# Patient Record
Sex: Female | Born: 1975 | Race: White | Hispanic: No | Marital: Married | Smoking: Former smoker
Health system: Southern US, Community
[De-identification: ages and names within clinical notes are randomized; demographics above are authoritative.]

## PROBLEM LIST (undated history)

## (undated) ENCOUNTER — Inpatient Hospital Stay (HOSPITAL_COMMUNITY): Payer: Self-pay

## (undated) DIAGNOSIS — G43909 Migraine, unspecified, not intractable, without status migrainosus: Secondary | ICD-10-CM

## (undated) DIAGNOSIS — G4700 Insomnia, unspecified: Secondary | ICD-10-CM

## (undated) DIAGNOSIS — K219 Gastro-esophageal reflux disease without esophagitis: Secondary | ICD-10-CM

## (undated) DIAGNOSIS — R112 Nausea with vomiting, unspecified: Secondary | ICD-10-CM

## (undated) DIAGNOSIS — O24419 Gestational diabetes mellitus in pregnancy, unspecified control: Secondary | ICD-10-CM

## (undated) DIAGNOSIS — F32A Depression, unspecified: Secondary | ICD-10-CM

## (undated) DIAGNOSIS — J45990 Exercise induced bronchospasm: Secondary | ICD-10-CM

## (undated) DIAGNOSIS — L509 Urticaria, unspecified: Secondary | ICD-10-CM

## (undated) DIAGNOSIS — Z9889 Other specified postprocedural states: Secondary | ICD-10-CM

## (undated) DIAGNOSIS — K589 Irritable bowel syndrome without diarrhea: Secondary | ICD-10-CM

## (undated) DIAGNOSIS — R110 Nausea: Secondary | ICD-10-CM

## (undated) DIAGNOSIS — F329 Major depressive disorder, single episode, unspecified: Secondary | ICD-10-CM

## (undated) HISTORY — DX: Urticaria, unspecified: L50.9

## (undated) HISTORY — DX: Insomnia, unspecified: G47.00

## (undated) HISTORY — DX: Nausea: R11.0

## (undated) HISTORY — DX: Depression, unspecified: F32.A

## (undated) HISTORY — DX: Major depressive disorder, single episode, unspecified: F32.9

---

## 2000-09-13 ENCOUNTER — Encounter: Payer: Self-pay | Admitting: Family Medicine

## 2000-09-13 ENCOUNTER — Ambulatory Visit (HOSPITAL_COMMUNITY): Admission: RE | Admit: 2000-09-13 | Discharge: 2000-09-13 | Payer: Self-pay | Admitting: Family Medicine

## 2002-06-23 ENCOUNTER — Inpatient Hospital Stay (HOSPITAL_COMMUNITY): Admission: AD | Admit: 2002-06-23 | Discharge: 2002-06-23 | Payer: Self-pay | Admitting: Obstetrics and Gynecology

## 2002-06-23 ENCOUNTER — Encounter: Payer: Self-pay | Admitting: Obstetrics and Gynecology

## 2004-01-26 ENCOUNTER — Emergency Department (HOSPITAL_COMMUNITY): Admission: EM | Admit: 2004-01-26 | Discharge: 2004-01-27 | Payer: Self-pay | Admitting: *Deleted

## 2006-10-06 ENCOUNTER — Inpatient Hospital Stay (HOSPITAL_COMMUNITY): Admission: AD | Admit: 2006-10-06 | Discharge: 2006-10-06 | Payer: Self-pay | Admitting: Obstetrics and Gynecology

## 2006-10-16 ENCOUNTER — Inpatient Hospital Stay (HOSPITAL_COMMUNITY): Admission: AD | Admit: 2006-10-16 | Discharge: 2006-10-19 | Payer: Self-pay | Admitting: Obstetrics & Gynecology

## 2007-04-27 ENCOUNTER — Ambulatory Visit (HOSPITAL_COMMUNITY): Admission: RE | Admit: 2007-04-27 | Discharge: 2007-04-27 | Payer: Self-pay | Admitting: Family Medicine

## 2007-06-17 ENCOUNTER — Emergency Department (HOSPITAL_COMMUNITY): Admission: EM | Admit: 2007-06-17 | Discharge: 2007-06-17 | Payer: Self-pay | Admitting: Emergency Medicine

## 2007-06-17 ENCOUNTER — Encounter: Payer: Self-pay | Admitting: Orthopedic Surgery

## 2007-06-28 ENCOUNTER — Ambulatory Visit: Payer: Self-pay | Admitting: Orthopedic Surgery

## 2007-06-28 DIAGNOSIS — S40029A Contusion of unspecified upper arm, initial encounter: Secondary | ICD-10-CM | POA: Insufficient documentation

## 2009-02-18 ENCOUNTER — Ambulatory Visit (HOSPITAL_COMMUNITY): Admission: RE | Admit: 2009-02-18 | Discharge: 2009-02-18 | Payer: Self-pay | Admitting: Family Medicine
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
## 2012-08-04 ENCOUNTER — Encounter: Payer: Self-pay | Admitting: *Deleted

## 2012-09-25 ENCOUNTER — Encounter: Payer: Self-pay | Admitting: Nurse Practitioner

## 2012-09-25 ENCOUNTER — Ambulatory Visit (INDEPENDENT_AMBULATORY_CARE_PROVIDER_SITE_OTHER): Payer: 59 | Admitting: Nurse Practitioner

## 2012-09-25 VITALS — Temp 98.3°F | Wt 155.4 lb

## 2012-09-25 DIAGNOSIS — N390 Urinary tract infection, site not specified: Secondary | ICD-10-CM

## 2012-09-25 DIAGNOSIS — R300 Dysuria: Secondary | ICD-10-CM

## 2012-09-25 LAB — POCT URINALYSIS DIPSTICK: Spec Grav, UA: 1.015

## 2012-09-25 LAB — POCT UA - MICROSCOPIC ONLY
Mucus, UA: POSITIVE
WBC, Ur, HPF, POC: 5

## 2012-09-25 MED ORDER — NITROFURANTOIN MONOHYD MACRO 100 MG PO CAPS: 100.0000 mg | ORAL_CAPSULE | Freq: Two times a day (BID) | ORAL | Status: DC

## 2012-09-25 NOTE — Patient Instructions (Signed)
AZO as directed for bladder spasms.  Take for 48 hours then stop.

## 2012-09-26 ENCOUNTER — Encounter: Payer: Self-pay | Admitting: Nurse Practitioner

## 2012-09-26 NOTE — Progress Notes (Signed)
Subjective:  Presents for complaints of a foul odor that began in her urine yesterday. Today began having some dysuria and pain at the end of urination. Noticed slight blood in her urine x1. No fever. Taking fluids well. No urinary frequency. Urine cloudy and slightly darker than usual. Mild low back pain. No flank pain. Just finished a normal menstrual cycle. Uses condoms for birth control. No history of recent UTI. Nausea, no vomiting. Married, same sexual partner. No vaginal discharge.  Objective:   Temp(Src) 98.3 F (36.8 C) (Oral)  Wt 155 lb 6.4 oz (70.489 kg)  LMP 09/19/2012 NAD. Alert, oriented. Lungs clear. No CVA area tenderness. Heart regular rate rhythm. Abdomen soft nondistended with mild mid pelvic area tenderness. Urine microscopic 5+ WBCs with occasional RBC and multiple bacteria per HPF noted.  Assessment:Dysuria - Plan: POCT urinalysis dipstick  UTI (urinary tract infection) - Plan: POCT UA - Microscopic Only  Plan: Macrobid 100 mg twice a day x7 days. AZO for 48 hours then DC. Warning signs reviewed. Call back in 48 hours if no improvement, sooner if worse.

## 2012-11-09 ENCOUNTER — Ambulatory Visit (INDEPENDENT_AMBULATORY_CARE_PROVIDER_SITE_OTHER): Payer: 59 | Admitting: Family Medicine

## 2012-11-09 ENCOUNTER — Encounter: Payer: Self-pay | Admitting: Family Medicine

## 2012-11-09 VITALS — BP 134/88 | HR 70 | Wt 156.4 lb

## 2012-11-09 DIAGNOSIS — F329 Major depressive disorder, single episode, unspecified: Secondary | ICD-10-CM

## 2012-11-09 DIAGNOSIS — R51 Headache: Secondary | ICD-10-CM

## 2012-11-09 DIAGNOSIS — K219 Gastro-esophageal reflux disease without esophagitis: Secondary | ICD-10-CM
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
## 2012-11-09 NOTE — Patient Instructions (Signed)
If choking worsens, call us and the next step would be GI consult

## 2012-11-11 DIAGNOSIS — R51 Headache: Secondary | ICD-10-CM | POA: Insufficient documentation

## 2012-11-11 DIAGNOSIS — K219 Gastro-esophageal reflux disease without esophagitis: Secondary | ICD-10-CM | POA: Insufficient documentation

## 2012-11-11 DIAGNOSIS — R519 Headache, unspecified: Secondary | ICD-10-CM | POA: Insufficient documentation

## 2012-11-11 DIAGNOSIS — F32A Depression, unspecified: Secondary | ICD-10-CM | POA: Insufficient documentation

## 2012-11-11 DIAGNOSIS — F329 Major depressive disorder, single episode, unspecified: Secondary | ICD-10-CM | POA: Insufficient documentation

## 2012-11-12 ENCOUNTER — Telehealth: Payer: Self-pay | Admitting: Family Medicine

## 2012-11-12 ENCOUNTER — Ambulatory Visit: Payer: 59 | Admitting: Family Medicine

## 2012-11-12 MED ORDER — BUTALBITAL-ACETAMINOPHEN 50-325 MG PO TABS: ORAL_TABLET | ORAL | Status: DC

## 2012-11-12 NOTE — Telephone Encounter (Signed)
Discussed with pt and script sent [facility] [place] [facility] with 5 refills

## 2012-11-12 NOTE — Telephone Encounter (Signed)
Patient seen Dr. Vincente Poli this morning and she prescribed her Butabatol back in 2008, and Rx is to be moved for Dr. Brett Canales to start prescribing.  [facility] [place] [facility]

## 2012-11-12 NOTE — Telephone Encounter (Signed)
Ok, we'll do up to sixty per mo with five ref. Must last onemo. And, must be seen every six mos

## 2012-11-26 ENCOUNTER — Telehealth: Payer: Self-pay | Admitting: Family Medicine

## 2012-11-26 NOTE — Telephone Encounter (Signed)
zoloft 100mg  #45 one and a half qd with 5 refills called into [facility] [place]. Pt notified on her voicemail

## 2012-11-26 NOTE — Telephone Encounter (Signed)
Patient says pharm did not receive zoloft. She needs a refill.  [facility]

## 2013-03-14 ENCOUNTER — Encounter: Payer: Self-pay | Admitting: Family Medicine

## 2013-03-14 ENCOUNTER — Ambulatory Visit (INDEPENDENT_AMBULATORY_CARE_PROVIDER_SITE_OTHER): Payer: 59 | Admitting: Family Medicine

## 2013-03-14 VITALS — BP 120/86 | Temp 99.0°F | Ht 65.5 in | Wt 163.0 lb

## 2013-03-14 DIAGNOSIS — J329 Chronic sinusitis, unspecified: Secondary | ICD-10-CM

## 2013-03-14 MED ORDER — AZITHROMYCIN 250 MG PO TABS: ORAL_TABLET | ORAL | Status: DC

## 2013-03-14 NOTE — Progress Notes (Signed)
  Subjective:    Patient ID: [name], female    DOB: Mar 05, 1976, 37 y.o.   MRN: [id number]  Sinusitis This is a new problem. The current episode started in the past 7 days. The problem is unchanged. There has been no fever. Her pain is at a severity of 5/10. The pain is moderate. Associated symptoms include congestion, ear pain, headaches, sinus pressure and a sore throat. Past treatments include oral decongestants. The treatment provided no relief.   Neg strep exposure,  Pos cong   Review of Systems  HENT: Positive for congestion, ear pain, sinus pressure and sore throat.   Neurological: Positive for headaches.   Measure any fever--not yet Using advil and ibu bilt sinus    Objective:   Physical Exam  Alert moderate malaise. Lungs clear. Heart regular rate and rhythm. Frontal maxillary tenderness evident pharynx erythematous some tender anterior nodes.      Assessment & Plan:  Impression rhinosinusitis plan antibiotics prescribed. Symptomatic care discussed. WSL

## 2013-04-03 ENCOUNTER — Ambulatory Visit (INDEPENDENT_AMBULATORY_CARE_PROVIDER_SITE_OTHER): Payer: 59 | Admitting: *Deleted

## 2013-04-03 DIAGNOSIS — Z23 Encounter for immunization: Secondary | ICD-10-CM

## 2013-05-18 ENCOUNTER — Other Ambulatory Visit: Payer: Self-pay | Admitting: Nurse Practitioner

## 2013-05-19 ENCOUNTER — Other Ambulatory Visit: Payer: Self-pay | Admitting: Family Medicine

## 2013-05-20 NOTE — Telephone Encounter (Signed)
One mo ok will ntbs before further since controlled sub

## 2013-06-18 ENCOUNTER — Other Ambulatory Visit: Payer: Self-pay | Admitting: Family Medicine

## 2013-06-18 NOTE — Telephone Encounter (Signed)
Last office visit 03/14/13 (sick)

## 2013-06-18 NOTE — Telephone Encounter (Signed)
On dec 15th, we said needs ov before more ref numb 30 no refills. This is a controlled substance and we need to see q 6 months last ov for this june

## 2013-08-01 ENCOUNTER — Ambulatory Visit: Payer: 59 | Admitting: Nurse Practitioner

## 2013-08-05 ENCOUNTER — Telehealth: Payer: Self-pay | Admitting: Family Medicine

## 2013-08-05 MED ORDER — BUTALBITAL-ACETAMINOPHEN 50-325 MG PO TABS: ORAL_TABLET | ORAL | Status: DC

## 2013-08-05 NOTE — Telephone Encounter (Signed)
ok 

## 2013-08-05 NOTE — Telephone Encounter (Signed)
Patient notified

## 2013-08-05 NOTE — Telephone Encounter (Signed)
Patient could not come to followup appointment last week due to weather. She has a followup appointment on Friday, but she will run out of the medication she needs before then. She is hoping she can have one more refill on her ACETAMINOPHEN-BUTALBITAL 50-325 MG TABS.   [facility] [address]

## 2013-08-09 ENCOUNTER — Encounter: Payer: Self-pay | Admitting: Nurse Practitioner

## 2013-08-09 ENCOUNTER — Ambulatory Visit (INDEPENDENT_AMBULATORY_CARE_PROVIDER_SITE_OTHER): Payer: 59 | Admitting: Nurse Practitioner

## 2013-08-09 VITALS — BP 122/82 | Ht 65.5 in

## 2013-08-09 DIAGNOSIS — R51 Headache: Secondary | ICD-10-CM

## 2013-08-09 DIAGNOSIS — J309 Allergic rhinitis, unspecified: Secondary | ICD-10-CM

## 2013-08-09 DIAGNOSIS — F341 Dysthymic disorder: Secondary | ICD-10-CM

## 2013-08-09 DIAGNOSIS — J300 Vasomotor rhinitis: Secondary | ICD-10-CM

## 2013-08-09 DIAGNOSIS — K219 Gastro-esophageal reflux disease without esophagitis: Secondary | ICD-10-CM

## 2013-08-09 DIAGNOSIS — F418 Other specified anxiety disorders: Secondary | ICD-10-CM | POA: Insufficient documentation

## 2013-08-09 MED ORDER — BUTALBITAL-ACETAMINOPHEN 50-325 MG PO TABS: ORAL_TABLET | ORAL | Status: DC

## 2013-08-09 MED ORDER — SERTRALINE HCL 100 MG PO TABS: ORAL_TABLET | ORAL | Status: DC

## 2013-08-09 MED ORDER — OMEPRAZOLE 40 MG PO CPDR: 40.0000 mg | DELAYED_RELEASE_CAPSULE | Freq: Every day | ORAL | Status: DC

## 2013-08-09 NOTE — Patient Instructions (Signed)
  Nasacort AQ  Diet for Gastroesophageal Reflux Disease, Adult Reflux (acid reflux) is when acid from your stomach flows up into the esophagus. When acid comes in contact with the esophagus, the acid causes irritation and soreness (inflammation) in the esophagus. When reflux happens often or so severely that it causes damage to the esophagus, it is called gastroesophageal reflux disease (GERD). Nutrition therapy can help ease the discomfort of GERD. FOODS OR DRINKS TO AVOID OR LIMIT  Smoking or chewing tobacco. Nicotine is one of the most potent stimulants to acid production in the gastrointestinal tract.  Caffeinated and decaffeinated coffee and black tea.  Regular or low-calorie carbonated beverages or energy drinks (caffeine-free carbonated beverages are allowed).   Strong spices, such as black pepper, white pepper, red pepper, cayenne, curry powder, and chili powder.  Peppermint or spearmint.  Chocolate.  High-fat foods, including meats and fried foods. Extra added fats including oils, butter, salad dressings, and nuts. Limit these to less than 8 tsp per day.  Fruits and vegetables if they are not tolerated, such as citrus fruits or tomatoes.  Alcohol.  Any food that seems to aggravate your condition. If you have questions regarding your diet, call your caregiver or a registered dietitian. OTHER THINGS THAT MAY HELP GERD INCLUDE:   Eating your meals slowly, in a relaxed setting.  Eating 5 to 6 small meals per day instead of 3 large meals.  Eliminating food for a period of time if it causes distress.  Not lying down until 3 hours after eating a meal.  Keeping the head of your bed raised 6 to 9 inches (15 to 23 cm) by using a foam wedge or blocks under the legs of the bed. Lying flat may make symptoms worse.  Being physically active. Weight loss may be helpful in reducing reflux in overweight or obese adults.  Wear loose fitting clothing EXAMPLE MEAL PLAN This meal plan  is approximately 2,000 calories based on https://www.[place].org/ChooseMyPlate.gov meal planning guidelines. Breakfast   cup cooked oatmeal.  1 cup strawberries.  1 cup low-fat milk.  1 oz almonds. Snack  1 cup cucumber slices.  6 oz yogurt (made from low-fat or fat-free milk). Lunch  2 slice whole-wheat bread.  2 oz sliced [place]urkey.  2 tsp mayonnaise.  1 cup blueberries.  1 cup snap peas. Snack  6 whole-wheat crackers.  1 oz string cheese. Dinner   cup brown rice.  1 cup mixed veggies.  1 tsp olive oil.  3 oz grilled fish. Document Released: 05/23/2005 Document Revised: 08/15/2011 Document Reviewed: 04/08/2011 [facility] Patient Information 2014 [place], [place].

## 2013-08-10 ENCOUNTER — Encounter: Payer: Self-pay | Admitting: Nurse Practitioner

## 2013-08-10 NOTE — Progress Notes (Signed)
Subjective:  Presents for recheck. Averaging about 2 migraines per year requesting refills on her headache medicine to have on hand. Also a flareup of her reflux symptoms lately. To omeprazole 40 mg which completely relieved her symptoms. No nausea vomiting. No abdominal pain. Bowels normal limit. Limited caffeine intake. Rare social alcohol. Nonsmoker. No excessive NSAID use. Also complaints of head congestion with off and on facial area pressure. No fever. Rare cough. Scratchy throat. Ear pressure. Doing well on Zoloft. Has consulted with her gynecologist since she plans to try to conceive again sometime in the next year. Has been clear to continue Zoloft and current headache medicine  which she has taken during previous pregnancies without difficulty.  Objective:   BP 122/82  Ht 5' 5.5" (1.664 m) NAD. Alert, oriented. TMs clear effusion, no erythema. pharynx not erythematous with cloudy PND noted. Neck supple with mild soft anterior adenopathy. Lungs clear. Heart regular rate rhythm. Abdomen soft nondistended nontender.    Assessment: Problem List Items Addressed This Visit     Digestive   Esophageal reflux   Relevant Medications      omeprazole (PRILOSEC) capsule     Other   Headache(784.0) - Primary   Relevant Medications      sertraline (ZOLOFT) tablet      ACETAMINOPHEN-BUTALBITAL 50-325 MG TABS   Depression with anxiety    Other Visit Diagnoses   Vasomotor rhinitis           Plan: Meds ordered this encounter  Medications  . sertraline (ZOLOFT) 100 MG tablet    Sig: TAKE 1 AND 1/2 TAB EVERY DAY    Dispense:  45 tablet    Refill:  5    Order Specific Question:  Supervising Provider    Answer:  Merlyn AlbertLUKING, WILLIAM S [[id number]]  . ACETAMINOPHEN-BUTALBITAL 50-325 MG TABS    Sig: 1-2 po q 4-6 hours prn headache    Dispense:  60 each    Refill:  5    Order Specific Question:  Supervising Provider    Answer:  Merlyn AlbertLUKING, WILLIAM S [[id number]]  . omeprazole (PRILOSEC) 40 MG capsule    Sig:  Take 1 capsule (40 mg total) by mouth daily. Prn acid reflux    Dispense:  30 capsule    Refill:  5    Order Specific Question:  Supervising Provider    Answer:  Merlyn AlbertLUKING, WILLIAM S [[id number]]   Call back in 2 weeks if reflux persists. May decrease to when necessary basis once symptoms have improved. Given written and verbal information on lifestyle factors affecting her reflux symptoms. OTC antihistamines and Nasacort AQ as directed. Otherwise routine followup in 6 months.

## 2013-08-30 ENCOUNTER — Telehealth: Payer: Self-pay | Admitting: Family Medicine

## 2013-08-30 MED ORDER — AZITHROMYCIN 250 MG PO TABS: ORAL_TABLET | ORAL | Status: DC

## 2013-08-30 NOTE — Telephone Encounter (Signed)
Error was made. °

## 2013-08-30 NOTE — Telephone Encounter (Signed)
Medication was sent to pharmacy. Phone number listed below is the wrong number.

## 2013-08-30 NOTE — Telephone Encounter (Signed)
ok 

## 2013-08-30 NOTE — Addendum Note (Signed)
Addended by: Dereck LigasJOHNSON, Rutha Melgoza P on: 08/30/2013 11:45 AM   Modules accepted: Orders

## 2013-08-30 NOTE — Telephone Encounter (Signed)
Patient is a fieldtrip in [place] has cough, head congestion, chest congestion ,no fever has been taking mucus-DM and muscus sinus/cold but no relief can you call in [facility] at [facility] [place] [facility]. She wont be back until 11:30 tonight.Let  Know if you can call it in [phone].

## 2013-09-08 ENCOUNTER — Other Ambulatory Visit: Payer: Self-pay | Admitting: Family Medicine

## 2013-09-08 ENCOUNTER — Other Ambulatory Visit: Payer: Self-pay | Admitting: Nurse Practitioner

## 2013-09-09 NOTE — Telephone Encounter (Signed)
caroly did five monthly ref at l;ast mo visit so let's do that again

## 2013-09-12 ENCOUNTER — Ambulatory Visit (HOSPITAL_COMMUNITY)
Admission: RE | Admit: 2013-09-12 | Discharge: 2013-09-12 | Disposition: A | Discharge status: Home / Self Care | Payer: 59 | Source: Ambulatory Visit | Attending: Family Medicine | Admitting: Family Medicine

## 2013-09-12 ENCOUNTER — Ambulatory Visit (INDEPENDENT_AMBULATORY_CARE_PROVIDER_SITE_OTHER): Payer: 59 | Admitting: Family Medicine

## 2013-09-12 ENCOUNTER — Encounter: Payer: Self-pay | Admitting: Family Medicine

## 2013-09-12 VITALS — BP 112/72 | Ht 65.5 in

## 2013-09-12 DIAGNOSIS — M25511 Pain in right shoulder: Secondary | ICD-10-CM

## 2013-09-12 DIAGNOSIS — M25519 Pain in unspecified shoulder: Secondary | ICD-10-CM | POA: Insufficient documentation

## 2013-09-12 MED ORDER — CHLORZOXAZONE 500 MG PO TABS: 500.0000 mg | ORAL_TABLET | Freq: Four times a day (QID) | ORAL | Status: DC | PRN

## 2013-09-12 NOTE — Progress Notes (Signed)
   Subjective:    Patient ID: [name], female    DOB: 06/28/1975, 38 y.o.   MRN: [id number]  HPIRight shoulder pain. Old softball injury from a couple years ago. Takes ibuprofen and tylenol.  Has done exercises and chirp prac care  Doesn't want surg  Right sharp stinging pain with throwing  tyl or ibu doesn't jhelp  Heating pafd nightly over a yr    Review of Systems No joint pain elsewhere no neck pain no back pain some radiation into the arm.    Objective:   Physical Exam Alert no acute distress vitals stable. Lungs clear. Heart regular rate and rhythm. Shoulder positive impingement sign. Positive tenderness in the a.c. joint region.  Procedure note patient was prepped draped anesthetized and injected with 1 cc Solu-Medrol and 2 cc Xylocaine     Assessment & Plan:  Impression chronic shoulder pain discussed at length. Patient adamant about not getting surgery or see an orthopedic doctor. However pain is been going on for years. Plan trial of therapeutic injection rationale discussed. X-ray involves shoulder. Local measures discussed. Codman's exercises discussed. WSL

## 2013-09-13 ENCOUNTER — Telehealth: Payer: Self-pay | Admitting: Family Medicine

## 2013-09-13 NOTE — Telephone Encounter (Signed)
Calling to get results to xray °

## 2013-09-15 NOTE — Telephone Encounter (Signed)
See res notify pt

## 2013-09-16 NOTE — Telephone Encounter (Signed)
Results discussed with patient. Patient verbalized understanding. 

## 2013-11-18 ENCOUNTER — Ambulatory Visit: Payer: 59 | Admitting: Family Medicine

## 2013-11-19 ENCOUNTER — Ambulatory Visit: Payer: 59 | Admitting: Family Medicine

## 2013-11-20 ENCOUNTER — Ambulatory Visit: Payer: 59 | Admitting: Family Medicine

## 2013-11-22 ENCOUNTER — Ambulatory Visit (INDEPENDENT_AMBULATORY_CARE_PROVIDER_SITE_OTHER): Payer: 59 | Admitting: Family Medicine

## 2013-11-22 ENCOUNTER — Encounter: Payer: Self-pay | Admitting: Family Medicine

## 2013-11-22 VITALS — BP 128/90 | Ht 65.5 in

## 2013-11-22 DIAGNOSIS — J329 Chronic sinusitis, unspecified: Secondary | ICD-10-CM

## 2013-11-22 DIAGNOSIS — J301 Allergic rhinitis due to pollen: Secondary | ICD-10-CM

## 2013-11-22 DIAGNOSIS — J310 Chronic rhinitis: Secondary | ICD-10-CM

## 2013-11-22 MED ORDER — CLARITHROMYCIN 500 MG PO TABS: 500.0000 mg | ORAL_TABLET | Freq: Two times a day (BID) | ORAL | Status: DC

## 2013-11-22 MED ORDER — METHYLPREDNISOLONE ACETATE 40 MG/ML IJ SUSP
80.0000 mg | Freq: Once | INTRAMUSCULAR | Status: AC
  Administered 2013-11-22: 80 mg via INTRAMUSCULAR

## 2013-11-22 NOTE — Progress Notes (Signed)
   Subjective:    Patient ID: [name], female    DOB: 06/13/1975, 38 y.o.   MRN: [id number]  Sinusitis This is a new problem. Episode onset: 8 weeks ago. The problem is unchanged. There has been no fever. Associated symptoms include congestion, coughing, headaches, sinus pressure and sneezing. Past treatments include oral decongestants, spray decongestants, saline sprays, lying down and acetaminophen. The treatment provided no relief.   Cong and drainage  Sig trouble with allergies  Taking a lot of meds and symptoms  Afrin clogs up after having used  Sig sneezing one after the othe  Bad pressure frontal headache  Sinus pressure ongoing   Nettie pots helps a litlle  Night clogged up  /     Review of Systems  HENT: Positive for congestion, sinus pressure and sneezing.   Respiratory: Positive for cough.   Neurological: Positive for headaches.       Objective:   Physical Exam  Alert mild malaise. Vitals reviewed. HEENT. Moderate nasal congestion. TMs somewhat retracted pharynx normal. Neck supple. Lungs clear. Heart regular in rhythm.      Assessment & Plan:  Impression 1 protracted rhinosinusitis/allergic rhinitis plan Depo-Medrol injection. Use steroid nasal spray faithfully. Antibiotics prescribed. Symptomatic care discussed. WSL

## 2013-11-24 DIAGNOSIS — J3089 Other allergic rhinitis: Secondary | ICD-10-CM | POA: Insufficient documentation

## 2013-12-22 ENCOUNTER — Other Ambulatory Visit: Payer: Self-pay | Admitting: Nurse Practitioner

## 2013-12-23 NOTE — Telephone Encounter (Signed)
60 was rxed on march 6 with five more ref, how come out already???

## 2013-12-23 NOTE — Telephone Encounter (Signed)
Last seen 11/22/13.

## 2013-12-24 NOTE — Telephone Encounter (Signed)
Pt said she is only getting her rx refilled once a month. She believes it is a [facility]. She will call us and let us know if she has any trouble.

## 2014-02-08 ENCOUNTER — Other Ambulatory Visit: Payer: Self-pay | Admitting: Nurse Practitioner

## 2014-03-06 ENCOUNTER — Ambulatory Visit (INDEPENDENT_AMBULATORY_CARE_PROVIDER_SITE_OTHER): Payer: 59 | Admitting: Nurse Practitioner

## 2014-03-06 ENCOUNTER — Encounter: Payer: Self-pay | Admitting: Nurse Practitioner

## 2014-03-06 VITALS — BP 130/90 | Ht 65.5 in | Wt 181.0 lb

## 2014-03-06 DIAGNOSIS — R5383 Other fatigue: Secondary | ICD-10-CM

## 2014-03-06 DIAGNOSIS — F418 Other specified anxiety disorders: Secondary | ICD-10-CM

## 2014-03-06 DIAGNOSIS — G4700 Insomnia, unspecified: Secondary | ICD-10-CM

## 2014-03-06 MED ORDER — SERTRALINE HCL 100 MG PO TABS: ORAL_TABLET | ORAL | Status: DC

## 2014-03-06 MED ORDER — TEMAZEPAM 15 MG PO CAPS: 15.0000 mg | ORAL_CAPSULE | Freq: Every evening | ORAL | Status: DC | PRN

## 2014-03-06 MED ORDER — BUTALBITAL-ACETAMINOPHEN 50-325 MG PO TABS: ORAL_TABLET | ORAL | Status: DC

## 2014-03-06 MED ORDER — OMEPRAZOLE 40 MG PO CPDR: DELAYED_RELEASE_CAPSULE | ORAL | Status: DC

## 2014-03-07 ENCOUNTER — Encounter: Payer: Self-pay | Admitting: Nurse Practitioner

## 2014-03-07 LAB — TSH: TSH: 1.614 u[IU]/mL (ref 0.350–4.500)

## 2014-03-09 ENCOUNTER — Encounter: Payer: Self-pay | Admitting: Nurse Practitioner

## 2014-03-09 NOTE — Progress Notes (Signed)
Subjective:  Presents for recheck. Limited activity during the summer due to back injury. Has slowly worked back up 2 running 3 miles per week for the past 8 weeks. Difficulty sleeping very week, early morning awakenings. Limited caffeine intake. Each weekend patient is completely exhausted due to lack of sleep during the week. Has a very stressful job. Reflux stable on omeprazole. [facility] her father has thyroid disease.  Objective:   BP 130/90  Ht 5' 5.5" (1.664 m)  Wt 181 lb (82.101 kg)  BMI 29.65 kg/m2 NAD. Alert, oriented. Lungs clear. Heart regular rate rhythm. Thyroid no masses or goiters, nontender to palpation. Abdomen soft nondistended nontender.   Assessment:  Problem List Items Addressed This Visit     Other   Depression with anxiety - Primary    Other Visit Diagnoses   Insomnia        Other fatigue        Relevant Orders       TSH (Completed)      Plan: Meds ordered this encounter  Medications  . temazepam (RESTORIL) 15 MG capsule    Sig: Take 1 capsule (15 mg total) by mouth at bedtime as needed for sleep.    Dispense:  30 capsule    Refill:  0    Order Specific Question:  Supervising Provider    Answer:  Merlyn AlbertLUKING, WILLIAM S [[id number]]  . sertraline (ZOLOFT) 100 MG tablet    Sig: TAKE 1 AND 1/2 TAB EVERY DAY    Dispense:  45 tablet    Refill:  5    Order Specific Question:  Supervising Provider    Answer:  Merlyn AlbertLUKING, WILLIAM S [[id number]]  . omeprazole (PRILOSEC) 40 MG capsule    Sig: TAKE 1 CAPSULE (40 MG TOTAL) BY MOUTH DAILY AS NEEDED FOR ACID REFLUX    Dispense:  30 capsule    Refill:  5    Order Specific Question:  Supervising Provider    Answer:  Merlyn AlbertLUKING, WILLIAM S [[id number]]  . ACETAMINOPHEN-BUTALBITAL 50-325 MG TABS    Sig: 1-2 q 4-6 hrs prn headache    Dispense:  60 each    Refill:  5    Order Specific Question:  Supervising Provider    Answer:  Merlyn AlbertLUKING, WILLIAM S [[id number]]   Trial of temazepam for sleep. To use mainly during the week. Call back if no improvement.  Discussed importance of stress reduction and continued exercise. Return in about 6 months (around 09/05/2014).

## 2014-03-25 ENCOUNTER — Telehealth: Payer: Self-pay | Admitting: Family Medicine

## 2014-03-25 MED ORDER — CLONAZEPAM 1 MG PO TABS: 1.0000 mg | ORAL_TABLET | Freq: Every evening | ORAL | Status: DC | PRN

## 2014-03-25 NOTE — Telephone Encounter (Signed)
Notified patient to stop restoril and take Clonazepam one mg numb thirty one qhs prn sleep five ref, if this doesn't work will need ov to discuss plan c. Patient verbalized understanding.

## 2014-03-25 NOTE — Telephone Encounter (Signed)
restoril generally does not cause that rxn, but apparently does in [name]. Clonazepam one mg numb thirty one qhs prn sleep five ref, if this doesn't work will need ov to discuss plan c

## 2014-03-25 NOTE — Telephone Encounter (Signed)
temazepam (RESTORIL) 15 MG capsule  Pt recently put on this med, it is really making her stomach have cramping And diarrhea by the morning time.   Pt would like to know what a plan b would be if a nurse or someone could  Call her back to discuss this.   thanks

## 2014-04-18 ENCOUNTER — Telehealth: Payer: Self-pay | Admitting: Nurse Practitioner

## 2014-04-18 ENCOUNTER — Ambulatory Visit: Payer: 59 | Admitting: Nurse Practitioner

## 2014-04-18 ENCOUNTER — Ambulatory Visit (INDEPENDENT_AMBULATORY_CARE_PROVIDER_SITE_OTHER): Payer: 59 | Admitting: Nurse Practitioner

## 2014-04-18 VITALS — BP 136/88 | Ht 65.5 in | Wt 183.0 lb

## 2014-04-18 DIAGNOSIS — G4700 Insomnia, unspecified: Secondary | ICD-10-CM

## 2014-04-18 MED ORDER — PHENTERMINE-TOPIRAMATE ER 7.5-46 MG PO CP24: ORAL_CAPSULE | ORAL | Status: DC

## 2014-04-18 MED ORDER — PHENTERMINE-TOPIRAMATE ER 3.75-23 MG PO CP24: ORAL_CAPSULE | ORAL | Status: DC

## 2014-04-18 NOTE — Telephone Encounter (Signed)
OMG! Let me call rep for this drug next week and find out what is the deal! That is the most I have ever heard charged for this. Hold on med until then

## 2014-04-18 NOTE — Telephone Encounter (Signed)
Phentermine-Topiramate (QSYMIA) 3.75-23 MG CP2  This script is free first two weeks, after that is it $240 for the script  She needs a plan b, an do you want her to take this med for the two weeks  Or not use it an just go with plan b    [facility]

## 2014-04-19 ENCOUNTER — Encounter: Payer: Self-pay | Admitting: Nurse Practitioner

## 2014-04-19 NOTE — Progress Notes (Signed)
Subjective:  Presents for recheck of her insomnia. Was switched to Klonopin, takes half tab at bedtime for sleep which is working very well. Had side effects on temazepam. Also interested in starting weight loss medication, has restarted her running program. Most of her eating is done between 9-11 PM.  Objective:   BP 136/88 mmHg  Ht 5' 5.5" (1.664 m) NAD. Alert, oriented. Lungs clear. Heart regular rate rhythm. BMI 30.  Assessment: Insomnia  Plan: Meds ordered this encounter  Medications  . Phentermine-Topiramate (QSYMIA) 3.75-23 MG CP24    Sig: One po qam    Dispense:  14 capsule    Refill:  0    Order Specific Question:  Supervising Provider    Answer:  Merlyn AlbertLUKING, WILLIAM S [[id number]]  . Phentermine-Topiramate (QSYMIA) 7.5-46 MG CP24    Sig: One po qam    Dispense:  30 capsule    Refill:  2    Order Specific Question:  Supervising Provider    Answer:  Merlyn AlbertLUKING, WILLIAM S [[id number]]   Call back if qsymia is too expensive. Hold on phentermine since this cannot be taken late in the day and may add to her insomnia. Patient to stop medication if she becomes pregnant. No plans for pregnancy at this point.

## 2014-05-06 ENCOUNTER — Telehealth: Payer: Self-pay | Admitting: Family Medicine

## 2014-05-06 NOTE — Telephone Encounter (Signed)
Eber JonesCarolyn,   Pt would like to know if you got anywhere with the Qsymia? If you can't do that cheaper then  Can you go a different route? She don't want to waste money with baby steps but doesn't want  To just jump out the gate full cost right off the bat either. She is wanting something to be sent in  Rather soon if possible.   Please contact pt for further details

## 2014-05-09 NOTE — Telephone Encounter (Signed)
Was this done?

## 2014-05-13 ENCOUNTER — Other Ambulatory Visit: Payer: Self-pay

## 2014-05-13 DIAGNOSIS — Z1231 Encounter for screening mammogram for malignant neoplasm of breast: Secondary | ICD-10-CM

## 2014-05-13 NOTE — Telephone Encounter (Signed)
Pt checking on the status of this again today, please advise pt as soon as you can  She states this is kind of time sensitive

## 2014-05-14 ENCOUNTER — Other Ambulatory Visit: Payer: Self-pay | Admitting: Obstetrics and Gynecology

## 2014-05-14 DIAGNOSIS — R922 Inconclusive mammogram: Secondary | ICD-10-CM

## 2014-05-14 DIAGNOSIS — Z9189 Other specified personal risk factors, not elsewhere classified: Secondary | ICD-10-CM

## 2014-05-21 NOTE — Telephone Encounter (Signed)
Patient wants to stick Qysemia for 3 months but want to jump up 2 doses instead of one from the starter lever and pay out of pocket and then stop and try to get pregnant.

## 2014-05-21 NOTE — Telephone Encounter (Signed)
We have checked everywhere in the office. Cannot find the name of drug rep for med. I can try the website OR on my desk this am was information on new med for weight loss.

## 2014-05-22 ENCOUNTER — Other Ambulatory Visit: Payer: Self-pay | Admitting: Nurse Practitioner

## 2014-05-22 ENCOUNTER — Other Ambulatory Visit: Payer: Self-pay | Admitting: *Deleted

## 2014-05-22 MED ORDER — PHENTERMINE-TOPIRAMATE ER 11.25-69 MG PO CP24: ORAL_CAPSULE | ORAL | Status: DC

## 2014-05-22 NOTE — Telephone Encounter (Signed)
Patient notified

## 2014-05-22 NOTE — Telephone Encounter (Signed)
Will order new dose. I will still check on line because that cost is ridiculous.

## 2014-06-04 ENCOUNTER — Ambulatory Visit: Payer: 59

## 2014-06-10 ENCOUNTER — Telehealth: Payer: Self-pay | Admitting: Family Medicine

## 2014-06-10 ENCOUNTER — Ambulatory Visit (INDEPENDENT_AMBULATORY_CARE_PROVIDER_SITE_OTHER): Payer: 59 | Admitting: Family Medicine

## 2014-06-10 ENCOUNTER — Encounter: Payer: Self-pay | Admitting: Family Medicine

## 2014-06-10 VITALS — Temp 98.6°F | Ht 65.5 in | Wt 174.6 lb

## 2014-06-10 DIAGNOSIS — A084 Viral intestinal infection, unspecified: Secondary | ICD-10-CM

## 2014-06-10 MED ORDER — DIPHENOXYLATE-ATROPINE 2.5-0.025 MG PO TABS: 1.0000 | ORAL_TABLET | Freq: Four times a day (QID) | ORAL | Status: DC | PRN

## 2014-06-10 MED ORDER — ONDANSETRON 4 MG PO TBDP: 4.0000 mg | ORAL_TABLET | ORAL | Status: DC | PRN

## 2014-06-10 MED ORDER — ONDANSETRON HCL 4 MG PO TABS: 4.0000 mg | ORAL_TABLET | Freq: Four times a day (QID) | ORAL | Status: DC | PRN

## 2014-06-10 NOTE — Telephone Encounter (Signed)
Left VM stating the medication was sent in. 

## 2014-06-10 NOTE — Telephone Encounter (Signed)
ondansetron (ZOFRAN-ODT) 4 MG disintegrating tablet   Pt states her insurance is going to charger her $90 for 16 tablets Is there a cheap one she can try?   [facility]

## 2014-06-10 NOTE — Progress Notes (Signed)
   Subjective:    Patient ID: [name], female    DOB: 03/26/1976, 39 y.o.   MRN: [id number]  Emesis  This is a new problem. The current episode started in the past 7 days. Associated symptoms include abdominal pain and diarrhea. Associated symptoms comments: cramping.   Hit hard Sunday night  Fever none present  Shivering burning up and   Appetite not good  Dry heaving and bad cramping  diarhea and dry heaving  Phen supp used prn and gingerale  abd pain bad and cramping  No blod in stools, greenish and yellowis   Mid dec not nearly , farly normal in between but chaotic with sched   Review of Systems  Gastrointestinal: Positive for vomiting, abdominal pain and diarrhea.       Objective:   Physical Exam Alert moderate malaise. Vitals reviewed. Hydration decent. H&T normal. Lungs clear. Heart regular in rhythm. Abdomen hyperactive bowel sounds       Assessment & Plan:   Impression acute viral gastroenteritis plans to Medicare discussed. Warning signs discussed. Medications prescribed. WSL

## 2014-06-10 NOTE — Telephone Encounter (Signed)
nondissolvable

## 2014-06-12 ENCOUNTER — Ambulatory Visit
Admission: RE | Admit: 2014-06-12 | Discharge: 2014-06-12 | Disposition: A | Discharge status: Home / Self Care | Payer: 59 | Source: Ambulatory Visit

## 2014-06-12 DIAGNOSIS — Z1231 Encounter for screening mammogram for malignant neoplasm of breast: Secondary | ICD-10-CM

## 2014-07-14 ENCOUNTER — Ambulatory Visit (INDEPENDENT_AMBULATORY_CARE_PROVIDER_SITE_OTHER): Payer: 59 | Admitting: Nurse Practitioner

## 2014-07-14 ENCOUNTER — Ambulatory Visit (HOSPITAL_COMMUNITY)
Admission: RE | Admit: 2014-07-14 | Discharge: 2014-07-14 | Disposition: A | Discharge status: Home / Self Care | Payer: 59 | Source: Ambulatory Visit | Attending: Nurse Practitioner | Admitting: Nurse Practitioner

## 2014-07-14 ENCOUNTER — Other Ambulatory Visit (HOSPITAL_COMMUNITY)
Admission: RE | Admit: 2014-07-14 | Discharge: 2014-07-14 | Disposition: A | Discharge status: Home / Self Care | Payer: 59 | Source: Ambulatory Visit | Attending: Family Medicine | Admitting: Family Medicine

## 2014-07-14 ENCOUNTER — Encounter: Payer: Self-pay | Admitting: Nurse Practitioner

## 2014-07-14 ENCOUNTER — Other Ambulatory Visit: Payer: Self-pay

## 2014-07-14 ENCOUNTER — Other Ambulatory Visit: Payer: Self-pay | Admitting: Nurse Practitioner

## 2014-07-14 VITALS — BP 114/80 | Temp 98.1°F | Ht 65.5 in | Wt 167.4 lb

## 2014-07-14 DIAGNOSIS — R1032 Left lower quadrant pain: Secondary | ICD-10-CM

## 2014-07-14 DIAGNOSIS — R109 Unspecified abdominal pain: Secondary | ICD-10-CM

## 2014-07-14 DIAGNOSIS — R197 Diarrhea, unspecified: Secondary | ICD-10-CM

## 2014-07-14 LAB — CBC WITH DIFFERENTIAL/PLATELET
BASOS PCT: 1 % (ref 0–1)
Basophils Absolute: 0.1 10*3/uL (ref 0.0–0.1)
Eosinophils Absolute: 0.1 10*3/uL (ref 0.0–0.7)
Eosinophils Relative: 1 % (ref 0–5)
HCT: 42 % (ref 36.0–46.0)
Hemoglobin: 14.2 g/dL (ref 12.0–15.0)
LYMPHS PCT: 25 % (ref 12–46)
Lymphs Abs: 2 10*3/uL (ref 0.7–4.0)
MCH: 28.8 pg (ref 26.0–34.0)
MCHC: 33.8 g/dL (ref 30.0–36.0)
MCV: 85.2 fL (ref 78.0–100.0)
MONO ABS: 0.6 10*3/uL (ref 0.1–1.0)
Monocytes Relative: 8 % (ref 3–12)
NEUTROS ABS: 5.5 10*3/uL (ref 1.7–7.7)
NEUTROS PCT: 65 % (ref 43–77)
PLATELETS: 299 10*3/uL (ref 150–400)
RBC: 4.93 MIL/uL (ref 3.87–5.11)
RDW: 13 % (ref 11.5–15.5)
WBC: 8.3 10*3/uL (ref 4.0–10.5)

## 2014-07-14 LAB — HEPATIC FUNCTION PANEL
ALT: 18 U/L (ref 0–35)
AST: 16 U/L (ref 0–37)
Albumin: 4.4 g/dL (ref 3.5–5.2)
Alkaline Phosphatase: 68 U/L (ref 39–117)
BILIRUBIN DIRECT: 0.1 mg/dL (ref 0.0–0.5)
Indirect Bilirubin: 0.4 mg/dL (ref 0.3–0.9)
Total Bilirubin: 0.5 mg/dL (ref 0.3–1.2)
Total Protein: 7.6 g/dL (ref 6.0–8.3)

## 2014-07-14 LAB — BASIC METABOLIC PANEL
ANION GAP: 4 — AB (ref 5–15)
BUN: 12 mg/dL (ref 6–23)
CALCIUM: 9 mg/dL (ref 8.4–10.5)
CO2: 27 mmol/L (ref 19–32)
CREATININE: 0.74 mg/dL (ref 0.50–1.10)
Chloride: 108 mmol/L (ref 96–112)
GFR calc Af Amer: >90 mL/min (ref >90)
GLUCOSE: 97 mg/dL (ref 70–99)
POTASSIUM: 3.7 mmol/L (ref 3.5–5.1)
SODIUM: 139 mmol/L (ref 135–145)

## 2014-07-14 LAB — HCG, SERUM, QUALITATIVE: Preg, Serum: NEGATIVE

## 2014-07-14 LAB — LIPASE, BLOOD: Lipase: 24 U/L (ref 11–59)

## 2014-07-14 MED ORDER — IOHEXOL 300 MG/ML  SOLN
100.0000 mL | Freq: Once | INTRAMUSCULAR | Status: AC | PRN
  Administered 2014-07-14: 100 mL via INTRAVENOUS

## 2014-07-14 MED ORDER — DICYCLOMINE HCL 10 MG PO CAPS: ORAL_CAPSULE | ORAL | Status: DC

## 2014-07-14 NOTE — Patient Instructions (Signed)
Align

## 2014-07-14 NOTE — Progress Notes (Signed)
Subjective:  Presents complaints of diarrhea and left lower quadrant abdominal pain that began 3 days ago. This is her third episode of similar symptoms since mid December. Was diagnosed with viral gastroenteritis the previous 2 times. The symptoms are very similar to the ones before. Noticed that when she misses doses of Qsymia that this may be a trigger. No documented fever. Hot and cold chills. No urinary symptoms. Frequent diarrhea first day, this has improved. Pain mainly in the left lower quadrant describes as a cramping to the point where she is in a fetal position. Pain very severe at times. Has tried Zofran and Lomotil with only slight improvement. Taking some fluids. Just completed a normal menstrual cycle.  Objective:   BP 114/80 mmHg  Temp(Src) 98.1 F (36.7 C) (Oral)  Ht 5' 5.5" (1.664 m)  Wt 167 lb 6 oz (75.921 kg)  BMI 27.42 kg/m2 NAD. Alert, oriented. Lungs clear. Heart regular rate rhythm. Abdomen soft mildly distended with active bowel sounds 4; minimal upper abdominal tenderness. Distinct left lower quadrant tenderness with guarding. No obvious masses.  Assessment: Abdominal pain, left lower quadrant - Plan: CBC with Differential/Platelet, Hepatic function panel, Basic metabolic panel, Lipase, hCG, serum, qualitative, CT Abdomen Pelvis W Contrast  Plan:  Meds ordered this encounter  Medications  . dicyclomine (BENTYL) 10 MG capsule    Sig: One po QID prn bowel spasms    Dispense:  40 capsule    Refill:  0    Order Specific Question:  Supervising Provider    Answer:  Riccardo DubinLUKING, WILLIAM S [[id number]   Stat labs and CT scan pending. Further follow-up based on results. Add align to regimen. Hold on Lomotil due to risk of constipation. Bentyl as directed for abdominal cramping.

## 2014-07-15 ENCOUNTER — Other Ambulatory Visit: Payer: Self-pay | Admitting: Nurse Practitioner

## 2014-07-15 ENCOUNTER — Encounter: Payer: Self-pay | Admitting: Internal Medicine

## 2014-07-15 DIAGNOSIS — R1032 Left lower quadrant pain: Secondary | ICD-10-CM

## 2014-07-15 DIAGNOSIS — K529 Noninfective gastroenteritis and colitis, unspecified: Secondary | ICD-10-CM

## 2014-07-17 LAB — OVA AND PARASITE EXAMINATION: OP: NONE SEEN

## 2014-07-17 LAB — CLOSTRIDIUM DIFFICILE BY PCR: Toxigenic C. Difficile by PCR: NOT DETECTED

## 2014-07-18 ENCOUNTER — Telehealth: Payer: Self-pay | Admitting: Nurse Practitioner

## 2014-07-18 NOTE — Telephone Encounter (Signed)
Calling to check on results to the samples that she had taken to the lab for her colitis.  She wanted to know if the results could be viewed and an antibiotic [facility]

## 2014-07-20 LAB — STOOL CULTURE

## 2014-07-23 ENCOUNTER — Other Ambulatory Visit: Payer: Self-pay | Admitting: Nurse Practitioner

## 2014-07-23 NOTE — Telephone Encounter (Signed)
Last stool culture was not available until 2/16. See phone note.

## 2014-07-24 NOTE — Progress Notes (Signed)
Patient notified and verbalized understanding of the test results. No further questions. 

## 2014-07-30 ENCOUNTER — Telehealth: Payer: Self-pay | Admitting: Nurse Practitioner

## 2014-07-30 NOTE — Telephone Encounter (Signed)
Pt states it's time for her qsymia dose increase can you please  Get this sent in for her

## 2014-07-31 ENCOUNTER — Other Ambulatory Visit: Payer: Self-pay | Admitting: Nurse Practitioner

## 2014-07-31 MED ORDER — PHENTERMINE-TOPIRAMATE ER 15-92 MG PO CP24: ORAL_CAPSULE | ORAL | Status: DC

## 2014-07-31 NOTE — Progress Notes (Signed)
Notified patient that script will be faxed to pharmacy today and needs office visit before further refills after this Rx. Patient verbalized understanding.

## 2014-08-05 ENCOUNTER — Ambulatory Visit: Payer: 59 | Admitting: Nurse Practitioner

## 2014-08-18 ENCOUNTER — Other Ambulatory Visit: Payer: Self-pay

## 2014-08-18 ENCOUNTER — Encounter: Payer: Self-pay | Admitting: Nurse Practitioner

## 2014-08-18 ENCOUNTER — Ambulatory Visit (INDEPENDENT_AMBULATORY_CARE_PROVIDER_SITE_OTHER): Payer: 59 | Admitting: Nurse Practitioner

## 2014-08-18 VITALS — BP 125/82 | HR 85 | Temp 97.4°F | Ht 65.0 in | Wt 166.6 lb

## 2014-08-18 DIAGNOSIS — R1032 Left lower quadrant pain: Secondary | ICD-10-CM

## 2014-08-18 DIAGNOSIS — R197 Diarrhea, unspecified: Secondary | ICD-10-CM

## 2014-08-18 DIAGNOSIS — K589 Irritable bowel syndrome without diarrhea: Secondary | ICD-10-CM | POA: Insufficient documentation

## 2014-08-18 MED ORDER — PEG-KCL-NACL-NASULF-NA ASC-C 100 G PO SOLR: 1.0000 | ORAL | Status: DC

## 2014-08-18 NOTE — Progress Notes (Addendum)
Primary Care Physician:  Rubbie Battiest, MD Primary Gastroenterologist:  Dr. Oneida Alar  Chief Complaint  Patient presents with  . Ulcerative Colitis  . Abdominal Pain    HPI:   39 year old female presents on referral from PCP for colitis. Has had 3 episodes of LLQ pain and diarrhea since December 2015, first two times she was diagnosed with viral gastroenteritis. Third presentation labs and stool cultures as well as CT A&P were done. Stool culture, O&P, CDiff all negative. CBC, BMP, HFP, Lipase all normal. CT showed moderate thickening of the proximal and transverse colon compatible with colitis, most likely infectious.  Today states since seeing her PCP she was doing well until this past week, which was "bad." Has been altering her diet with simple, bland foods with avoidance of fried/greasy foods. This past weekend she had a bit salad with fried tortilla shell and "paid for it all weekend long." States she's had occasional symptoms like this before but since December has become repetitive and worse. States "it fees like the colon has become so irritated the littlest thing will throw it off." Episodes tend to last 4-5 days. Denies N/V but takes Zofran. Diarrhea varies from loose to watery, and will gradually return to normal. Denies hematochezia and melena. Admits mucus in stool. States "it almost seems like the food is passing through too quickly." Has never had a colonoscopy. Denies fever, chills, unintentional weight loss. Has had decreased appetite.    No past medical history on file.  No past surgical history on file.  Current Outpatient Prescriptions  Medication Sig Dispense Refill  . ACETAMINOPHEN-BUTALBITAL 50-325 MG TABS TAKE 1-2 TABLETS EVERY 4-6 HOURS AS DIRECTED FOR HEADACHE 60 each 2  . clonazePAM (KLONOPIN) 1 MG tablet Take 1 tablet (1 mg total) by mouth at bedtime as needed (sleep). 30 tablet 5  . dicyclomine (BENTYL) 10 MG capsule One po QID prn bowel spasms 40 capsule 0  .  diphenoxylate-atropine (LOMOTIL) 2.5-0.025 MG per tablet Take 1 tablet by mouth every 6 (six) hours as needed for diarrhea or loose stools. 24 tablet 0  . loratadine (CLARITIN) 10 MG tablet Take 10 mg by mouth daily.    . Multiple Vitamins-Minerals (MULTIVITAMIN GUMMIES WOMENS) CHEW Chew 2 tablets by mouth.    Marland Kitchen omeprazole (PRILOSEC) 40 MG capsule TAKE 1 CAPSULE (40 MG TOTAL) BY MOUTH DAILY AS NEEDED FOR ACID REFLUX 30 capsule 5  . ondansetron (ZOFRAN) 4 MG tablet Take 1 tablet (4 mg total) by mouth every 6 (six) hours as needed for nausea or vomiting. 16 tablet 0  . Phentermine-Topiramate 15-92 MG CP24 One po q am 30 capsule 2  . sertraline (ZOLOFT) 100 MG tablet TAKE 1 AND 1/2 TAB EVERY DAY 45 tablet 5  . peg 3350 powder (MOVIPREP) 100 G SOLR Take 1 kit (200 g total) by mouth as directed. 1 kit 0   No current facility-administered medications for this visit.    Allergies as of 08/18/2014 - Review Complete 08/18/2014  Allergen Reaction Noted  . Amoxicillin-pot clavulanate    . Cefuroxime axetil    . Cephalosporins  08/04/2012  . Latex  11/09/2012  . Levaquin [levofloxacin in d5w]  08/04/2012  . Temazepam Diarrhea 04/18/2014    No family history on file.  History   Social History  . Marital Status: Married    Spouse Name: N/A  . Number of Children: N/A  . Years of Education: N/A   Occupational History  . Not on file.  Social History Main Topics  . Smoking status: Former Research scientist (life sciences)  . Smokeless tobacco: Never Used  . Alcohol Use: Yes     Comment: rare social   . Drug Use: No  . Sexual Activity: Yes    Birth Control/ Protection: Condom   Other Topics Concern  . Not on file   Social History Narrative    Review of Systems: General: Negative for anorexia, weight loss, fever, chills, fatigue, weakness. Eyes: Negative for vision changes.  ENT: Negative for hoarseness, difficulty swallowing , nasal congestion. CV: Negative for chest pain, angina, palpitations, dyspnea on  exertion, peripheral edema.  Respiratory: Negative for dyspnea at rest, dyspnea on exertion, wheezing.  GI: See history of present illness. MS: Negative for joint pain, low back pain.  Derm: Negative for rash or itching.  Neuro: Negative for weakness, abnormal sensation, seizure, frequent headaches, memory loss, confusion.  Psych: Negative for anxiety, depression, suicidal ideation, hallucinations.  Endo: Negative for unusual weight change.  Heme: Negative for bruising or bleeding. Allergy: Negative for rash or hives.   Physical Exam: BP 125/82 mmHg  Pulse 85  Temp(Src) 97.4 F (36.3 C)  Ht _0  (1.651 m)  Wt 166 lb 9.6 oz (75.569 kg)  BMI 27.72 kg/m2  LMP 08/02/2014 General:   Alert and oriented. Pleasant and cooperative. Well-nourished and well-developed.  Head:  Normocephalic and atraumatic. Eyes:  Without icterus, sclera clear and conjunctiva pink.  Ears:  Normal auditory acuity. Nose:  No deformity, discharge,  or lesions. Mouth:  No deformity or lesions, oral mucosa pink.  Neck:  Supple, without mass or thyromegaly. Lungs:  Clear to auscultation bilaterally. No wheezes, rales, or rhonchi. No distress.  Heart:  S1, S2 present without murmurs appreciated.  Abdomen:  +BS, soft, and non-distended. Noted mild LLQ and RLQ TTP. No HSM noted. No guarding or rebound. No masses appreciated.  Rectal:  Deferred  Msk:  Symmetrical without gross deformities. Normal posture. Pulses:  Normal pulses noted. Extremities:  Without clubbing or edema. Neurologic:  Alert and  oriented x4;  grossly normal neurologically. Skin:  Intact without significant lesions or rashes. Cervical Nodes:  No significant cervical adenopathy. Psych:  Alert and cooperative. Normal mood and affect.     08/18/2014 1:21 PM

## 2014-08-18 NOTE — Assessment & Plan Note (Signed)
Patient with her third bout of diarrhea and abdominal pain since early December. Versed 2 bouts were diagnosed as viral gastroenteritis. With her third symptomatic episode labs are ordered as well as a CT of the abdomen and pelvis. C visit note for details. CT suggestive of colitis possibly infectious. However CBC was within normal range and stool studies all negative. Etiologies include infectious colitis, inflammatory colitis, or sequela of recurrent viral gastroenteritis. At this time we'll check labs including CRP, ESR, and tissue transglutaminase IgA. We'll also start patient on a probiotic at this time. We'll also proceed with a colonoscopy.  Proceed with TCS with Dr. Gala Romney in near future: the risks, benefits, and alternatives have been discussed with the patient in detail. The patient states understanding and desires to proceed.

## 2014-08-18 NOTE — Patient Instructions (Signed)
1. Start taking in OTC probiotic daily (there are several available, you can ask the pharmacist for most recent recommendations) 2. Avoid trigger foods 3. We are ordering labs, have those drawn within the next few days 4. We will schedule your procedure (colonoscopy) for you. 5. Further recommendations to be based on the results of your procedure and labs.

## 2014-08-19 NOTE — Progress Notes (Signed)
cc'ed to pcp °

## 2014-08-23 ENCOUNTER — Other Ambulatory Visit: Payer: Self-pay | Admitting: Nurse Practitioner

## 2014-09-11 LAB — SEDIMENTATION RATE: SED RATE: 1 mm/h (ref 0–20)

## 2014-09-11 LAB — C-REACTIVE PROTEIN

## 2014-09-12 ENCOUNTER — Encounter (HOSPITAL_COMMUNITY)
Admission: RE | Disposition: A | Discharge status: Home / Self Care | Payer: Self-pay | Source: Ambulatory Visit | Attending: Gastroenterology

## 2014-09-12 ENCOUNTER — Other Ambulatory Visit: Payer: Self-pay

## 2014-09-12 ENCOUNTER — Ambulatory Visit (HOSPITAL_COMMUNITY)
Admission: RE | Admit: 2014-09-12 | Discharge: 2014-09-12 | Disposition: A | Discharge status: Home / Self Care | Payer: 59 | Source: Ambulatory Visit | Attending: Gastroenterology | Admitting: Gastroenterology

## 2014-09-12 ENCOUNTER — Encounter (HOSPITAL_COMMUNITY): Payer: Self-pay

## 2014-09-12 DIAGNOSIS — F329 Major depressive disorder, single episode, unspecified: Secondary | ICD-10-CM | POA: Insufficient documentation

## 2014-09-12 DIAGNOSIS — Z888 Allergy status to other drugs, medicaments and biological substances status: Secondary | ICD-10-CM | POA: Insufficient documentation

## 2014-09-12 DIAGNOSIS — K5730 Diverticulosis of large intestine without perforation or abscess without bleeding: Secondary | ICD-10-CM | POA: Insufficient documentation

## 2014-09-12 DIAGNOSIS — R197 Diarrhea, unspecified: Secondary | ICD-10-CM

## 2014-09-12 DIAGNOSIS — Z87891 Personal history of nicotine dependence: Secondary | ICD-10-CM | POA: Insufficient documentation

## 2014-09-12 DIAGNOSIS — R1032 Left lower quadrant pain: Secondary | ICD-10-CM | POA: Insufficient documentation

## 2014-09-12 DIAGNOSIS — G4700 Insomnia, unspecified: Secondary | ICD-10-CM | POA: Insufficient documentation

## 2014-09-12 DIAGNOSIS — Z91040 Latex allergy status: Secondary | ICD-10-CM | POA: Diagnosis not present

## 2014-09-12 DIAGNOSIS — Z881 Allergy status to other antibiotic agents status: Secondary | ICD-10-CM | POA: Insufficient documentation

## 2014-09-12 DIAGNOSIS — K644 Residual hemorrhoidal skin tags: Secondary | ICD-10-CM | POA: Diagnosis not present

## 2014-09-12 HISTORY — PX: COLONOSCOPY: SHX5424

## 2014-09-12 LAB — TISSUE TRANSGLUTAMINASE, IGA: TISSUE TRANSGLUTAMINASE AB, IGA: 1 U/mL (ref <4)

## 2014-09-12 SURGERY — COLONOSCOPY
Anesthesia: Moderate Sedation

## 2014-09-12 MED ORDER — SODIUM CHLORIDE 0.9 % IV SOLN
INTRAVENOUS | Status: DC
  Administered 2014-09-12: 09:00:00 via INTRAVENOUS

## 2014-09-12 MED ORDER — MIDAZOLAM HCL 5 MG/5ML IJ SOLN
INTRAMUSCULAR | Status: AC
  Filled 2014-09-12: qty 10

## 2014-09-12 MED ORDER — MIDAZOLAM HCL 5 MG/5ML IJ SOLN
INTRAMUSCULAR | Status: DC | PRN
  Administered 2014-09-12 (×2): 2 mg via INTRAVENOUS

## 2014-09-12 MED ORDER — MEPERIDINE HCL 100 MG/ML IJ SOLN
INTRAMUSCULAR | Status: DC | PRN
  Administered 2014-09-12 (×2): 50 mg via INTRAVENOUS

## 2014-09-12 MED ORDER — STERILE WATER FOR IRRIGATION IR SOLN
Status: DC | PRN
  Administered 2014-09-12: 10:00:00

## 2014-09-12 MED ORDER — MEPERIDINE HCL 100 MG/ML IJ SOLN
INTRAMUSCULAR | Status: AC
  Filled 2014-09-12: qty 2

## 2014-09-12 MED ORDER — PROMETHAZINE HCL 25 MG/ML IJ SOLN
INTRAMUSCULAR | Status: DC | PRN
  Administered 2014-09-12: 12.5 mg via INTRAVENOUS

## 2014-09-12 MED ORDER — PROMETHAZINE HCL 25 MG/ML IJ SOLN
INTRAMUSCULAR | Status: AC
  Filled 2014-09-12: qty 1

## 2014-09-12 MED ORDER — ELUXADOLINE 100 MG PO TABS: 100.0000 mg | ORAL_TABLET | Freq: Two times a day (BID) | ORAL | Status: DC

## 2014-09-12 NOTE — Op Note (Addendum)
[facility] [address] [place], [id number]   COLONOSCOPY PROCEDURE REPORT  PATIENT: [name], [name]  MR#: [id number] BIRTHDATE: [birth year] , 39  yrs. old GENDER: female ENDOSCOPIST: West BaliSandi L Jaman Aro, MD REFERRED UX:[id number]:Stephen Gerda DissLuking, M.D. PROCEDURE DATE:  09/12/2014 PROCEDURE:   Colonoscopy with biopsy INDICATIONS:abdominal pain in the lower left quadrant and unexplained diarrhea.  CT SCAN FEB 2016 R COLITIS MEDICATIONS: Promethazine (Phenergan) 12.5 mg IV, Demerol 100 mg IV, and Versed 4 mg IV  DESCRIPTION OF PROCEDURE:    Physical exam was performed.  Informed consent was obtained from the patient after explaining the benefits, risks, and alternatives to procedure.  The patient was connected to monitor and placed in left lateral position. Continuous oxygen was provided by nasal cannula and IV medicine administered through an indwelling cannula.  After administration of sedation and rectal exam, the patients rectum was intubated and the EC-[id number] ([id number]  colonoscope was advanced under direct visualization to the ileum.  The scope was removed slowly by carefully examining the color, texture, anatomy, and integrity mucosa on the way out.  The patient was recovered in endoscopy and discharged home in satisfactory condition.    COLON FINDINGS: The examined terminal ileum appeared to be normal.  10-15 CM VISUALIZED. , There was mild diverticulosis noted in the right colon. COLD BIOPSIES OBTAINED IN THE RIGHT COLON AND RECTUM. , and Moderate sized EXTERNAL hemorrhoids were found.  PREP QUALITY: good. CECAL W/D TIME: 11       minutes COMPLICATIONS: None  ENDOSCOPIC IMPRESSION: 1.   NO OBVIOUS SOURCE FOR DIARRHEA/ABDOMINAL PAINIDETIFIED. IT'S MOST LIKELY DUE TO IBS-D. 2.   Mild diverticulosis in the right colon 3.   Moderate sized EXTERNAL hemorrhoids  RECOMMENDATIONS: AVOID DAIRY FOR 3 MOS. ADD VIBERZI 100 MG TWICE DAILY WITH FOOD . MED WARNING  GIVEN. Follow a HIGH FIBER DIET. AWAIT BIOPSY. FOLLOW UP IN 3 MOS. Next colonoscopy AT AGE 39.    _______________________________ Rosalie DoctoreSignedWest Bali:  Gerrit Rafalski L Tyronn Golda, MD 09/12/2014 3:23 PM Revised: 09/12/2014 3:23 PM  CPT CODES: ICD CODES:  The ICD and CPT codes recommended by this software are interpretations from the data that the clinical staff has captured with the software.  The verification of the translation of this report to the ICD and CPT codes and modifiers is the sole responsibility of the health care institution and practicing physician where this report was generated.  [employer] will not be held responsible for the validity of the ICD and CPT codes included on this report.  AMA assumes no liability for data contained or not contained herein. CPT is a Publishing rights managerregistered trademark of the [employer].

## 2014-09-12 NOTE — Progress Notes (Signed)
REVIEWED-NO ADDITIONAL RECOMMENDATIONS. 

## 2014-09-12 NOTE — Discharge Instructions (Signed)
You have EXTERNAL hemorrhoids and diverticulosis IN YOUR RIGHT COLON.  YOUR SMALL BOWEL IS NORMAL. YOUR ABDOMINAL PAIN AND DIARRHEA ARE MOST LIKELY DUE TO IBS. I BIOPSIED YOUR COLON.   AVOID DAIRY FOR 3 MOS.  ADD VIBERZI 100 MG TWICE DAILY WITH FOOD TO TREAT YOUR DIARRHEA AND ABDOMINAL PAIN. IT CAN CAUSE ABDOMINAL PAIN, NAUSEA, OR VOMITING. STOP IF YOU HAVE NOT HAD A BOWEL MOVEMENT AFTER 4 DAYS AND CAL THE OFFICE.  Follow a HIGH FIBER DIET. AVOID ITEMS THAT CAUSE BLOATING. See info below.  YOUR BIOPSY RESULTS WILL BE AVAILABLE IN MY CHART AFTER APR 12 AND MY OFFICE WILL CONTACT YOU IN 10-14 DAYS WITH YOUR RESULTS.   FOLLOW UP IN 3 MOS.   Next colonoscopy AT AGE 89.  Colonoscopy Care After Read the instructions outlined below and refer to this sheet in the next week. These discharge instructions provide you with general information on caring for yourself after you leave the hospital. While your treatment has been planned according to the most current medical practices available, unavoidable complications occasionally occur. If you have any problems or questions after discharge, call DR. Blaklee Shores, [phone].  ACTIVITY  You may resume your regular activity, but move at a slower pace for the next 24 hours.   Take frequent rest periods for the next 24 hours.   Walking will help get rid of the air and reduce the bloated feeling in your belly (abdomen).   No driving for 24 hours (because of the medicine (anesthesia) used during the test).   You may shower.   Do not sign any important legal documents or operate any machinery for 24 hours (because of the anesthesia used during the test).    NUTRITION  Drink plenty of fluids.   You may resume your normal diet as instructed by your doctor.   Begin with a light meal and progress to your normal diet. Heavy or fried foods are harder to digest and may make you feel sick to your stomach (nauseated).   Avoid alcoholic beverages for 24 hours  or as instructed.    MEDICATIONS  You may resume your normal medications.   WHAT YOU CAN EXPECT TODAY  Some feelings of bloating in the abdomen.   Passage of more gas than usual.   Spotting of blood in your stool or on the toilet paper  .  IF YOU HAD POLYPS REMOVED DURING THE COLONOSCOPY:  Eat a soft diet IF YOU HAVE NAUSEA, BLOATING, ABDOMINAL PAIN, OR VOMITING.    FINDING OUT THE RESULTS OF YOUR TEST Not all test results are available during your visit. DR. Darrick PennaFIELDS WILL CALL YOU WITHIN 14 DAYS OF YOUR PROCEDUE WITH YOUR RESULTS. Do not assume everything is normal if you have not heard from DR. Ludean Duhart, CALL HER OFFICE AT [phone].  SEEK IMMEDIATE MEDICAL ATTENTION AND CALL THE OFFICE: [phone] IF:  You have more than a spotting of blood in your stool.   Your belly is swollen (abdominal distention).   You are nauseated or vomiting.   You have a temperature over 101F.   You have abdominal pain or discomfort that is severe or gets worse throughout the day.  High-Fiber Diet A high-fiber diet changes your normal diet to include more whole grains, legumes, fruits, and vegetables. Changes in the diet involve replacing refined carbohydrates with unrefined foods. The calorie level of the diet is essentially unchanged. The Dietary Reference Intake (recommended amount) for adult males is 38 grams per day. For adult females,  it is 25 grams per day. Pregnant and lactating women should consume 28 grams of fiber per day. Fiber is the intact part of a plant that is not broken down during digestion. Functional fiber is fiber that has been isolated from the plant to provide a beneficial effect in the body. PURPOSE  Increase stool bulk.   Ease and regulate bowel movements.   Lower cholesterol.  INDICATIONS THAT YOU NEED MORE FIBER  Constipation and hemorrhoids.   Uncomplicated diverticulosis (intestine condition) and irritable bowel syndrome.   Weight management.   As a  protective measure against hardening of the arteries (atherosclerosis), diabetes, and cancer.   GUIDELINES FOR INCREASING FIBER IN THE DIET  Start adding fiber to the diet slowly. A gradual increase of about 5 more grams (2 slices of whole-wheat bread, 2 servings of most fruits or vegetables, or 1 bowl of high-fiber cereal) per day is best. Too rapid an increase in fiber may result in constipation, flatulence, and bloating.   Drink enough water and fluids to keep your urine clear or pale yellow. Water, juice, or caffeine-free drinks are recommended. Not drinking enough fluid may cause constipation.   Eat a variety of high-fiber foods rather than one type of fiber.   Try to increase your intake of fiber through using high-fiber foods rather than fiber pills or supplements that contain small amounts of fiber.   The goal is to change the types of food eaten. Do not supplement your present diet with high-fiber foods, but replace foods in your present diet.  INCLUDE A VARIETY OF FIBER SOURCES  Replace refined and processed grains with whole grains, canned fruits with fresh fruits, and incorporate other fiber sources. White rice, white breads, and most bakery goods contain little or no fiber.   Brown whole-grain rice, buckwheat oats, and many fruits and vegetables are all good sources of fiber. These include: broccoli, Brussels sprouts, cabbage, cauliflower, beets, sweet potatoes, white potatoes (skin on), carrots, tomatoes, eggplant, squash, berries, fresh fruits, and dried fruits.   Cereals appear to be the richest source of fiber. Cereal fiber is found in whole grains and bran. Bran is the fiber-rich outer coat of cereal grain, which is largely removed in refining. In whole-grain cereals, the bran remains. In breakfast cereals, the largest amount of fiber is found in those with "bran" in their names. The fiber content is sometimes indicated on the label.   You may need to include additional fruits  and vegetables each day.   In baking, for 1 cup white flour, you may use the following substitutions:   1 cup whole-wheat flour minus 2 tablespoons.   1/2 cup white flour plus 1/2 cup whole-wheat flour.   Diverticulosis Diverticulosis is a common condition that develops when small pouches (diverticula) form in the wall of the colon. The risk of diverticulosis increases with age. It happens more often in people who eat a low-fiber diet. Most individuals with diverticulosis have no symptoms. Those individuals with symptoms usually experience belly (abdominal) pain, constipation, or loose stools (diarrhea).  HOME CARE INSTRUCTIONS  Increase the amount of fiber in your diet as directed by your caregiver or dietician. This may reduce symptoms of diverticulosis.   Drink at least 6 to 8 glasses of water each day to prevent constipation.   Try not to strain when you have a bowel movement.   Avoiding nuts and seeds to prevent complications is still an uncertain benefit.       FOODS HAVING HIGH FIBER  CONTENT INCLUDE:  Fruits. Apple, peach, pear, tangerine, raisins, prunes.   Vegetables. Brussels sprouts, asparagus, broccoli, cabbage, carrot, cauliflower, romaine lettuce, spinach, summer squash, tomato, winter squash, zucchini.   Starchy Vegetables. Baked beans, kidney beans, lima beans, split peas, lentils, potatoes (with skin).   Grains. Whole wheat bread, brown rice, bran flake cereal, plain oatmeal, white rice, shredded wheat, bran muffins.    Hemorrhoids Hemorrhoids are dilated (enlarged) veins around the rectum. Sometimes clots will form in the veins. This makes them swollen and painful. These are called thrombosed hemorrhoids. Causes of hemorrhoids include:  Constipation.   Straining to have a bowel movement.   HEAVY LIFTING HOME CARE INSTRUCTIONS  Eat a well balanced diet and drink 6 to 8 glasses of water every day to avoid constipation. You may also use a bulk laxative.     Avoid straining to have bowel movements.   Keep anal area dry and clean.   Do not use a donut shaped pillow or sit on the toilet for long periods. This increases blood pooling and pain.   Move your bowels when your body has the urge; this will require less straining and will decrease pain and pressure.

## 2014-09-12 NOTE — H&P (Signed)
Primary Care Physician:  Rubbie Battiest, MD Primary Gastroenterologist:  Dr. Oneida Alar  Pre-Procedure History & Physical: HPI:  [name] is a 39 y.o. female here for ABDOMINAL  PAIN/DIARRHEA-abnl R colon in CT.  Past Medical History  Diagnosis Date  . Insomnia   . Headaches due to old head injury   . Depression   . Nausea     Past Surgical History  Procedure Laterality Date  . None to date      08/18/14    Prior to Admission medications   Medication Sig Start Date End Date Taking? Authorizing Provider  ACETAMINOPHEN-BUTALBITAL 50-325 MG TABS TAKE 1-2 TABLETS EVERY 4-6 HOURS AS DIRECTED FOR HEADACHE 07/24/14  Yes Nilda Simmer, NP  clonazePAM (KLONOPIN) 1 MG tablet Take 1 tablet (1 mg total) by mouth at bedtime as needed (sleep). 03/25/14  Yes Mikey Kirschner, MD  dicyclomine (BENTYL) 10 MG capsule One po QID prn bowel spasms 07/14/14  Yes Nilda Simmer, NP  diphenoxylate-atropine (LOMOTIL) 2.5-0.025 MG per tablet Take 1 tablet by mouth every 6 (six) hours as needed for diarrhea or loose stools. 06/10/14  Yes Mikey Kirschner, MD  loratadine (CLARITIN) 10 MG tablet Take 10 mg by mouth daily.   Yes Historical Provider, MD  Multiple Vitamins-Minerals (MULTIVITAMIN GUMMIES WOMENS) CHEW Chew 2 tablets by mouth.   Yes Historical Provider, MD  omeprazole (PRILOSEC) 40 MG capsule TAKE 1 CAPSULE (40 MG TOTAL) BY MOUTH DAILY AS NEEDED FOR ACID REFLUX 08/25/14  Yes Mikey Kirschner, MD  ondansetron (ZOFRAN) 4 MG tablet Take 1 tablet (4 mg total) by mouth every 6 (six) hours as needed for nausea or vomiting. 06/10/14  Yes Mikey Kirschner, MD  peg 3350 powder (MOVIPREP) 100 G SOLR Take 1 kit (200 g total) by mouth as directed. 08/18/14  Yes Danie Binder, MD  Phentermine-Topiramate 15-92 MG CP24 One po q am Patient taking differently: Take 1 tablet by mouth daily. One po q am 07/31/14  Yes Nilda Simmer, NP  sertraline (ZOLOFT) 100 MG tablet TAKE 1 AND 1/2 TAB EVERY DAY 03/06/14  Yes Nilda Simmer, NP    Allergies as of 08/18/2014 - Review Complete 08/18/2014  Allergen Reaction Noted  . Amoxicillin-pot clavulanate    . Cefuroxime axetil    . Cephalosporins  08/04/2012  . Latex  11/09/2012  . Levaquin [levofloxacin in d5w]  08/04/2012  . Temazepam Diarrhea 04/18/2014    Family History  Problem Relation Age of Onset  . Colon cancer Neg Hx     History   Social History  . Marital Status: Married    Spouse Name: N/A  . Number of Children: N/A  . Years of Education: N/A   Occupational History  . Not on file.   Social History Main Topics  . Smoking status: Former Research scientist (life sciences)  . Smokeless tobacco: Never Used  . Alcohol Use: Yes     Comment: rare social   . Drug Use: No  . Sexual Activity: Yes    Birth Control/ Protection: Condom   Other Topics Concern  . Not on file   Social History Narrative    Review of Systems: See HPI, otherwise negative ROS   Physical Exam: BP 129/103 mmHg  Pulse 86  Temp(Src) 98 F (36.7 C) (Oral)  Resp 14  Ht 5' 5.5" (1.664 m)  Wt 160 lb (72.576 kg)  BMI 26.21 kg/m2  SpO2 98%  LMP 09/02/2014 General:   Alert,  pleasant and  cooperative in NAD Head:  Normocephalic and atraumatic. Neck:  Supple; Lungs:  Clear throughout to auscultation.    Heart:  Regular rate and rhythm. Abdomen:  Soft, nontender and nondistended. Normal bowel sounds, without guarding, and without rebound.   Neurologic:  Alert and  oriented x4;  grossly normal neurologically.  Impression/Plan:    ABDOMINAL  PAIN/DIARRHEA-abnl R colon in CT   PLAN: 1. TCS TODAY-BIOPSY COLON

## 2014-09-12 NOTE — Progress Notes (Signed)
Patient "no longer itchy", skin clean, dry , within normal limits.

## 2014-09-12 NOTE — Progress Notes (Signed)
Patient noted to scratch skin at upper chest at sites of EKG leads removal. Denies Shortness of breath, denies tongue swelling or difficulty breathing. Pulse oximetry 100%.  Skin on patients back noted to be clean, dry and intact.

## 2014-09-16 ENCOUNTER — Encounter (HOSPITAL_COMMUNITY): Payer: Self-pay | Admitting: Gastroenterology

## 2014-09-22 ENCOUNTER — Telehealth: Payer: Self-pay | Admitting: Gastroenterology

## 2014-09-22 NOTE — Telephone Encounter (Signed)
Please call pt. HER colon and RECTAL biopsies are normal.   AVOID DAIRY FOR 3 MOS.  ADD VIBERZI 100 MG TWICE DAILY WITH FOOD TO TREAT YOUR DIARRHEA AND ABDOMINAL PAIN. IT CAN CAUSE ABDOMINAL PAIN, NAUSEA, OR VOMITING. STOP IF YOU HAVE NOT HAD A BOWEL MOVEMENT AFTER 4 DAYS AND CAL THE OFFICE.  Follow a HIGH FIBER DIET. AVOID ITEMS THAT CAUSE BLOATING.   FOLLOW UP IN 3 MOS E30 ABD PAIN/DIARRHEA.

## 2014-09-22 NOTE — Telephone Encounter (Signed)
Tried to call with no answer  

## 2014-09-22 NOTE — Telephone Encounter (Signed)
APPOINTMENT MADE °

## 2014-09-23 NOTE — Telephone Encounter (Signed)
Routing to DS 

## 2014-09-23 NOTE — Telephone Encounter (Signed)
Pt's mom, Johnston EbbsWendy Powell, called for pt since pt is out of the country at this time. I called back and LMOM that she is only listed as an emergency contact. I cannot tell her the into. Nothing to worry about, just have the pt call when she can so I can give her some instructions. 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

## 2014-09-23 NOTE — Telephone Encounter (Signed)
LMOM to call.

## 2014-09-25 NOTE — Telephone Encounter (Signed)
I mailed a letter to the pt with a print out of the message from Dr. Darrick PennaFields.

## 2014-10-01 ENCOUNTER — Other Ambulatory Visit: Payer: Self-pay | Admitting: Nurse Practitioner

## 2014-10-06 NOTE — Telephone Encounter (Signed)
Pt called office and is aware of test results

## 2014-10-08 ENCOUNTER — Telehealth: Payer: Self-pay | Admitting: Family Medicine

## 2014-10-08 NOTE — Telephone Encounter (Signed)
Rx prior auth APPROVED for pt's omeprazole (PRILOSEC) 40 MG capsule , expires 10/07/2017 through [facility]'s [facility] coverage, faxed approval [facility]

## 2014-10-24 ENCOUNTER — Other Ambulatory Visit: Payer: Self-pay | Admitting: Nurse Practitioner

## 2014-11-11 ENCOUNTER — Telehealth: Payer: Self-pay | Admitting: Nurse Practitioner

## 2014-11-11 NOTE — Telephone Encounter (Signed)
Pt is wanting a appt in the am 11/12/14 with [name] if possible or a  Refill on her headache meds,   ACETAMINOPHEN-BUTALBITAL 50-325 MG TABS      She has to go out of town for work tomorrow then off to [facility] shortly  After that.   Can we put her on for tomorrow or do you want to give her a refill?

## 2014-11-12 ENCOUNTER — Telehealth: Payer: Self-pay | Admitting: Nurse Practitioner

## 2014-11-12 ENCOUNTER — Other Ambulatory Visit: Payer: Self-pay | Admitting: Nurse Practitioner

## 2014-11-12 ENCOUNTER — Ambulatory Visit: Payer: 59 | Admitting: Nurse Practitioner

## 2014-11-12 MED ORDER — BUTALBITAL-ACETAMINOPHEN 50-325 MG PO TABS: ORAL_TABLET | ORAL | Status: DC

## 2014-11-12 NOTE — Telephone Encounter (Signed)
ACETAMINOPHEN-BUTALBITAL 50-325 MG TABS  Pt needs a refill on this med till she can get back into the office  For a med check after her work trip in a few weeks.   [facility]

## 2014-11-12 NOTE — Telephone Encounter (Signed)
Was given an appt for 6/8 am but she cancelled. See other note.

## 2014-12-12 ENCOUNTER — Ambulatory Visit: Payer: 59 | Admitting: Nurse Practitioner

## 2014-12-18 ENCOUNTER — Encounter: Payer: Self-pay | Admitting: Nurse Practitioner

## 2014-12-18 ENCOUNTER — Ambulatory Visit (INDEPENDENT_AMBULATORY_CARE_PROVIDER_SITE_OTHER): Payer: 59 | Admitting: Nurse Practitioner

## 2014-12-18 VITALS — BP 122/70 | Ht 65.5 in | Wt 165.5 lb

## 2014-12-18 DIAGNOSIS — F418 Other specified anxiety disorders: Secondary | ICD-10-CM | POA: Diagnosis not present

## 2014-12-18 DIAGNOSIS — F42 Obsessive-compulsive disorder: Secondary | ICD-10-CM | POA: Diagnosis not present

## 2014-12-18 DIAGNOSIS — F429 Obsessive-compulsive disorder, unspecified: Secondary | ICD-10-CM

## 2014-12-18 MED ORDER — SERTRALINE HCL 100 MG PO TABS: ORAL_TABLET | ORAL | Status: DC

## 2014-12-18 MED ORDER — BUTALBITAL-ACETAMINOPHEN 50-325 MG PO TABS: ORAL_TABLET | ORAL | Status: DC

## 2014-12-18 MED ORDER — CLONAZEPAM 1 MG PO TABS: 1.0000 mg | ORAL_TABLET | Freq: Every evening | ORAL | Status: DC | PRN

## 2014-12-18 NOTE — Patient Instructions (Signed)
Cognitive behavioral therapy

## 2014-12-19 ENCOUNTER — Encounter: Payer: Self-pay | Admitting: Nurse Practitioner

## 2014-12-19 DIAGNOSIS — F429 Obsessive-compulsive disorder, unspecified: Secondary | ICD-10-CM | POA: Insufficient documentation

## 2014-12-19 NOTE — Progress Notes (Signed)
Subjective:  Presents for recheck on her OCD and anxiety/depression. Overall doing well on Zoloft. Takes Klonopin at bedtime for sleep. Her level of OCD depends on her level of stress. 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 Is also seen Dr. Darrick Pennafields for diverticulitis. Having headaches mainly around the time of her cycle. This is the main time she takes her headache medication.  Objective:   BP 122/70 mmHg  Ht 5' 5.5" (1.664 m)  Wt 165 lb 8 oz (75.07 kg)  BMI 27.11 kg/m2 NAD. Alert, oriented. Mildly anxious affect. Lungs clear. Heart regular rate rhythm. Thoughts logical coherent and relevant. Dressed appropriately.  Assessment:  Problem List Items Addressed This Visit      Other   Depression with anxiety - Primary   OCD (obsessive compulsive disorder)     Plan:  Meds ordered this encounter  Medications  . clonazePAM (KLONOPIN) 1 MG tablet    Sig: Take 1 tablet (1 mg total) by mouth at bedtime as needed (sleep).    Dispense:  30 tablet    Refill:  5    Order Specific Question:  Supervising Provider    Answer:  Merlyn AlbertLUKING, WILLIAM S [[id number]]  . sertraline (ZOLOFT) 100 MG tablet    Sig: TAKE 1 AND 1/2 TAB EVERY DAY    Dispense:  45 tablet    Refill:  5    Order Specific Question:  Supervising Provider    Answer:  Merlyn AlbertLUKING, WILLIAM S [[id number]]  . ACETAMINOPHEN-BUTALBITAL 50-325 MG TABS    Sig: TAKE 1-2 TABLETS EVERY 4-6 HOURS AS DIRECTED FOR HEADACHE    Dispense:  60 each    Refill:  5    Order Specific Question:  Supervising Provider    Answer:  Merlyn AlbertLUKING, WILLIAM S [[id number]]   Continue current regimen. Strongly recommend the patient consider cognitive behavioral therapy for OCD. Again advised patient not to take Klonopin if she becomes pregnant. Has no plans for pregnancy at this time. Return in about 6 months (around 06/20/2015) for recheck.

## 2015-03-27 ENCOUNTER — Other Ambulatory Visit: Payer: Self-pay | Admitting: Family Medicine

## 2015-04-01 LAB — OB RESULTS CONSOLE RPR: RPR: NONREACTIVE

## 2015-04-01 LAB — OB RESULTS CONSOLE ABO/RH: RH Type: POSITIVE

## 2015-04-01 LAB — OB RESULTS CONSOLE RUBELLA ANTIBODY, IGM: RUBELLA: IMMUNE

## 2015-04-01 LAB — OB RESULTS CONSOLE HEPATITIS B SURFACE ANTIGEN: HEP B S AG: NEGATIVE

## 2015-04-01 LAB — OB RESULTS CONSOLE HIV ANTIBODY (ROUTINE TESTING): HIV: NONREACTIVE

## 2015-04-01 LAB — OB RESULTS CONSOLE ANTIBODY SCREEN: ANTIBODY SCREEN: NEGATIVE

## 2015-04-02 ENCOUNTER — Ambulatory Visit (INDEPENDENT_AMBULATORY_CARE_PROVIDER_SITE_OTHER): Payer: 59 | Admitting: *Deleted

## 2015-04-02 DIAGNOSIS — Z23 Encounter for immunization: Secondary | ICD-10-CM

## 2015-04-15 LAB — OB RESULTS CONSOLE GC/CHLAMYDIA
CHLAMYDIA, DNA PROBE: NEGATIVE
Gonorrhea: NEGATIVE

## 2015-05-06 ENCOUNTER — Ambulatory Visit (INDEPENDENT_AMBULATORY_CARE_PROVIDER_SITE_OTHER): Payer: 59 | Admitting: Family Medicine

## 2015-05-06 ENCOUNTER — Encounter: Payer: Self-pay | Admitting: Family Medicine

## 2015-05-06 VITALS — Temp 98.4°F | Ht 65.5 in | Wt 189.4 lb

## 2015-05-06 DIAGNOSIS — J019 Acute sinusitis, unspecified: Secondary | ICD-10-CM

## 2015-05-06 DIAGNOSIS — J3089 Other allergic rhinitis: Secondary | ICD-10-CM

## 2015-05-06 DIAGNOSIS — B9689 Other specified bacterial agents as the cause of diseases classified elsewhere: Secondary | ICD-10-CM

## 2015-05-06 MED ORDER — AMOXICILLIN 500 MG PO TABS: 500.0000 mg | ORAL_TABLET | Freq: Three times a day (TID) | ORAL | Status: DC

## 2015-05-06 NOTE — Progress Notes (Signed)
   Subjective:    Patient ID: [name], female    DOB: 02/10/1976, 39 y.o.   MRN: [id number]  Sinus Problem This is a new problem. The current episode started 1 to 4 weeks ago. Associated symptoms include congestion, coughing, ear pain, headaches and a sore throat. Pertinent negatives include no shortness of breath. Past treatments include oral decongestants (claritin, afrin).   PMH benign Patient is early pregnant  Review of Systems  Constitutional: Negative for fever and activity change.  HENT: Positive for congestion, ear pain, rhinorrhea and sore throat.   Eyes: Negative for discharge.  Respiratory: Positive for cough. Negative for shortness of breath and wheezing.   Cardiovascular: Negative for chest pain.  Neurological: Positive for headaches.       Objective:   Physical Exam  Constitutional: She appears well-developed.  HENT:  Head: Normocephalic.  Nose: Nose normal.  Mouth/Throat: Oropharynx is clear and moist. No oropharyngeal exudate.  Neck: Neck supple.  Cardiovascular: Normal rate and normal heart sounds.   No murmur heard. Pulmonary/Chest: Effort normal and breath sounds normal. She has no wheezes.  Lymphadenopathy:    She has no cervical adenopathy.  Skin: Skin is warm and dry.  Nursing note and vitals reviewed.         Assessment & Plan:  Viral syndrome secondary rhinosinusitis antibiotics prescribed warning signs discussed follow-up ongoing trouble, if high fevers chills wheezing difficulty breathing or worse follow-up immediately here or ER

## 2015-05-25 ENCOUNTER — Other Ambulatory Visit: Payer: Self-pay | Admitting: Nurse Practitioner

## 2015-05-25 NOTE — Telephone Encounter (Signed)
She was planning to get pregnant; if she is pregnant, I recommend checking with her OB before prescribing; if not, I will send in

## 2015-05-26 ENCOUNTER — Other Ambulatory Visit: Payer: Self-pay | Admitting: *Deleted

## 2015-05-26 MED ORDER — CEFDINIR 300 MG PO CAPS: 300.0000 mg | ORAL_CAPSULE | Freq: Two times a day (BID) | ORAL | Status: DC

## 2015-06-07 NOTE — L&D Delivery Note (Signed)
Delivery Note At 4:12 PM a viable female was delivered via  ROA Apgars 9 9 weight pending Placenta status: spontaneously with 3 vessel cord , .  Cord:  with the following complications nuchal cord x 2: .  Cord pH: *not obtained  Anesthesia: Epidural  Episiotomy:  none Lacerations: none  Suture Repair: not applicable Est. Blood Loss (mL):  300  Mom to postpartum.  Baby to Couplet care / Skin to Skin.  [name] L 10/28/2015, 4:23 PM

## 2015-06-18 ENCOUNTER — Ambulatory Visit: Payer: 59 | Admitting: Nurse Practitioner

## 2015-06-28 ENCOUNTER — Other Ambulatory Visit: Payer: Self-pay | Admitting: Family Medicine

## 2015-07-05 ENCOUNTER — Encounter (HOSPITAL_COMMUNITY): Payer: Self-pay

## 2015-07-05 ENCOUNTER — Inpatient Hospital Stay (HOSPITAL_COMMUNITY)
Admission: AD | Admit: 2015-07-05 | Discharge: 2015-07-05 | Disposition: A | Discharge status: Home / Self Care | Payer: Managed Care, Other (non HMO) | Source: Ambulatory Visit | Attending: Obstetrics and Gynecology | Admitting: Obstetrics and Gynecology

## 2015-07-05 DIAGNOSIS — Z3A22 22 weeks gestation of pregnancy: Secondary | ICD-10-CM | POA: Diagnosis not present

## 2015-07-05 DIAGNOSIS — Z87891 Personal history of nicotine dependence: Secondary | ICD-10-CM | POA: Diagnosis not present

## 2015-07-05 DIAGNOSIS — Z880 Allergy status to penicillin: Secondary | ICD-10-CM | POA: Diagnosis not present

## 2015-07-05 DIAGNOSIS — O219 Vomiting of pregnancy, unspecified: Secondary | ICD-10-CM

## 2015-07-05 DIAGNOSIS — O210 Mild hyperemesis gravidarum: Secondary | ICD-10-CM | POA: Insufficient documentation

## 2015-07-05 LAB — URINALYSIS, ROUTINE W REFLEX MICROSCOPIC
Bilirubin Urine: NEGATIVE
Glucose, UA: NEGATIVE mg/dL
HGB URINE DIPSTICK: NEGATIVE
Ketones, ur: 15 mg/dL — AB
Leukocytes, UA: NEGATIVE
Nitrite: NEGATIVE
Protein, ur: NEGATIVE mg/dL
SPECIFIC GRAVITY, URINE: 1.02 (ref 1.005–1.030)
pH: 7 (ref 5.0–8.0)

## 2015-07-05 MED ORDER — SODIUM CHLORIDE 0.9 % IV SOLN
12.5000 mg | Freq: Once | INTRAVENOUS | Status: AC
  Administered 2015-07-05: 12.5 mg via INTRAVENOUS
  Filled 2015-07-05: qty 0.5

## 2015-07-05 NOTE — MAU Note (Signed)
Pt c/o nausea and vomiting that started last night and has continued throughout the day. States she is unable to keep anything down. Had prescription for diclegis, which she ran out of. States this is new. Denies being around anyone sick. Has headache which she rates 6/10. Denies vag bleeding.

## 2015-07-05 NOTE — MAU Provider Note (Signed)
History   [id number]   Chief Complaint  Patient presents with  . Emesis During Pregnancy    HPI [name] is a 40 y.o. female  [id number] at [redacted]w[redacted]d IUP here with report of nausea and vomiting that started last night and has continued throughout the day. Reports chills also.  No report of fever.  Unable to keep anything down.  Vomited greater than five times in past 24 hours.  Prescription for diclegis, however does not have nay more.   Denies being around anyone sick. Positive headache which she rates 6/10. Denies vag bleeding or contractions.  No association with food consumption.    Patient's last menstrual period was 09/02/2014.  OB History  Gravida Para Term Preterm AB SAB TAB Ectopic Multiple Living  0 1 1 0 0 0 3    # Outcome Date GA Lbr Len/2nd Weight Sex Delivery Anes PTL Lv  5 Current           4 Term      Vag-Spont   Y  3 Term      Vag-Spont   Y  2 Term      Vag-Spont   Y  1 SAB               Past Medical History  Diagnosis Date  . Insomnia   . Headaches due to old head injury   . Depression   . Nausea     Family History  Problem Relation Age of Onset  . Colon cancer Neg Hx     Social History   Social History  . Marital Status: Married    Spouse Name: N/A  . Number of Children: N/A  . Years of Education: N/A   Social History Main Topics  . Smoking status: Former Games developer  . Smokeless tobacco: Never Used  . Alcohol Use: Yes     Comment: rare social   . Drug Use: No  . Sexual Activity: Yes    Birth Control/ Protection: Condom   Other Topics Concern  . None   Social History Narrative    Allergies  Allergen Reactions  . Amoxicillin-Pot Clavulanate   . Cefuroxime Axetil   . Cephalosporins     GI upset  . Latex   . Levaquin [Levofloxacin In D5w]     colitis  . Temazepam Diarrhea    No current facility-administered medications on file prior to encounter.   Current Outpatient Prescriptions on File Prior to Encounter  Medication Sig  Dispense Refill  . omeprazole (PRILOSEC) 40 MG capsule TAKE 1 CAPSULE (40 MG TOTAL) BY MOUTH DAILY AS NEEDED FOR ACID REFLUX 30 capsule 5  . sertraline (ZOLOFT) 100 MG tablet TAKE 1 AND 1/2 TAB EVERY DAY 45 tablet 5  . ACETAMINOPHEN-BUTALBITAL 50-325 MG TABS TAKE 1-2 TABLETS EVERY 4-6 HOURS AS DIRECTED FOR HEADACHE 60 each 5  . amoxicillin (AMOXIL) 500 MG tablet Take 1 tablet (500 mg total) by mouth 3 (three) times daily. (Patient not taking: Reported on 07/05/2015) 30 tablet 1  . cefdinir (OMNICEF) 300 MG capsule Take 1 capsule (300 mg total) by mouth 2 (two) times daily. (Patient not taking: Reported on 07/05/2015) 20 capsule 0  . clonazePAM (KLONOPIN) 1 MG tablet Take 1 tablet (1 mg total) by mouth at bedtime as needed (sleep). (Patient not taking: Reported on 07/05/2015) 30 tablet 5  . Eluxadoline (VIBERZI) 100 MG TABS Take 100 mg by mouth 2 (two) times daily. 60 tablet 5  Review of Systems  Constitutional: Positive for chills and fatigue. Negative for fever.  HENT: Negative for congestion and sore throat.   Respiratory: Negative for cough.   Gastrointestinal: Positive for nausea, vomiting and diarrhea (3-4 loose stools). Negative for abdominal pain.  Genitourinary: Negative for dysuria, frequency, flank pain, vaginal bleeding, vaginal discharge and pelvic pain.  All other systems reviewed and are negative.    Physical Exam   Filed Vitals:   07/05/15 2017  BP: 95/66  Pulse: 108  Temp: 98 F (36.7 C)  TempSrc: Oral  Resp: 18  Height:  (1.651 m)  Weight: 86.728 kg (191 lb 3.2 oz)  SpO2: 98%    Physical Exam  Constitutional: She is oriented to person, place, and time. She appears well-developed and well-nourished.  HENT:  Head: Normocephalic.  Mouth/Throat: Mucous membranes are dry.  Neck: Normal range of motion. Neck supple.  Cardiovascular: Normal rate, regular rhythm and normal heart sounds.   Respiratory: Effort normal and breath sounds normal.  GI: There is no  tenderness.  Genitourinary: No bleeding in the vagina. No vaginal discharge found.  Neurological: She is alert and oriented to person, place, and time. She has normal reflexes.  Skin: Skin is warm and dry. She is not diaphoretic.   Doppler 154 MAU Course  Procedures  2105 IV fluid ordered with 12.5 mg phenergan   MDM 2100 Consulted with Dr. Vincente Poli > Reviewed HPI/Exam/labs > give 1000cc IV fluid with 12.5 phenergan, if improves discharge home with PO phenergan  2230 Pt reports feeling better; tolerated soda and crackers > desires discharge home  Assessment and Plan  40 y.o. [id number] at [redacted]w[redacted]d IUP  Nausea and Vomiting in Pregnancy  Plan: Discharge home RX Phenergan Follow-up in office  Marlis Edelson, CNM 07/05/2015 10:43 PM

## 2015-07-05 NOTE — Discharge Instructions (Signed)

## 2015-07-14 ENCOUNTER — Other Ambulatory Visit (HOSPITAL_COMMUNITY): Payer: Self-pay | Admitting: Obstetrics and Gynecology

## 2015-07-14 DIAGNOSIS — Z8279 Family history of other congenital malformations, deformations and chromosomal abnormalities: Secondary | ICD-10-CM

## 2015-07-15 ENCOUNTER — Ambulatory Visit (HOSPITAL_COMMUNITY)
Admission: RE | Admit: 2015-07-15 | Discharge: 2015-07-15 | Disposition: A | Discharge status: Home / Self Care | Payer: Managed Care, Other (non HMO) | Source: Ambulatory Visit | Attending: Obstetrics and Gynecology | Admitting: Obstetrics and Gynecology

## 2015-07-15 ENCOUNTER — Other Ambulatory Visit (HOSPITAL_COMMUNITY): Payer: Self-pay | Admitting: Obstetrics and Gynecology

## 2015-07-15 ENCOUNTER — Encounter (HOSPITAL_COMMUNITY): Payer: Self-pay

## 2015-07-15 VITALS — BP 135/81 | HR 102 | Wt 198.0 lb

## 2015-07-15 DIAGNOSIS — O09523 Supervision of elderly multigravida, third trimester: Secondary | ICD-10-CM

## 2015-07-15 DIAGNOSIS — O09522 Supervision of elderly multigravida, second trimester: Secondary | ICD-10-CM

## 2015-07-15 DIAGNOSIS — Z8279 Family history of other congenital malformations, deformations and chromosomal abnormalities: Secondary | ICD-10-CM

## 2015-07-15 DIAGNOSIS — Z36 Encounter for antenatal screening of mother: Secondary | ICD-10-CM | POA: Insufficient documentation

## 2015-07-15 DIAGNOSIS — Z3689 Encounter for other specified antenatal screening: Secondary | ICD-10-CM

## 2015-07-15 DIAGNOSIS — O09292 Supervision of pregnancy with other poor reproductive or obstetric history, second trimester: Secondary | ICD-10-CM | POA: Insufficient documentation

## 2015-07-15 DIAGNOSIS — O2692 Pregnancy related conditions, unspecified, second trimester: Secondary | ICD-10-CM

## 2015-07-15 DIAGNOSIS — Z3A23 23 weeks gestation of pregnancy: Secondary | ICD-10-CM | POA: Diagnosis not present

## 2015-07-21 ENCOUNTER — Encounter (HOSPITAL_COMMUNITY): Payer: Self-pay

## 2015-07-21 ENCOUNTER — Other Ambulatory Visit (HOSPITAL_COMMUNITY): Payer: Self-pay

## 2015-08-02 ENCOUNTER — Other Ambulatory Visit: Payer: Self-pay | Admitting: Nurse Practitioner

## 2015-08-20 ENCOUNTER — Ambulatory Visit (HOSPITAL_COMMUNITY)
Admission: RE | Admit: 2015-08-20 | Discharge: 2015-08-20 | Disposition: A | Discharge status: Home / Self Care | Payer: Managed Care, Other (non HMO) | Source: Ambulatory Visit | Attending: Obstetrics and Gynecology | Admitting: Obstetrics and Gynecology

## 2015-08-20 DIAGNOSIS — O24419 Gestational diabetes mellitus in pregnancy, unspecified control: Secondary | ICD-10-CM

## 2015-08-20 NOTE — Progress Notes (Signed)
  Patient was seen on 08/20/15 for Gestational Diabetes self-management . The following learning objectives were met by the patient :   States the definition of Gestational Diabetes  States why dietary management is important in controlling blood glucose  Describes the effects of carbohydrates on blood glucose levels  Demonstrates ability to create a balanced meal plan  Demonstrates carbohydrate counting   States when to check blood glucose levels  Demonstrates proper blood glucose monitoring techniques  States the effect of stress and exercise on blood glucose levels  States the importance of limiting caffeine and abstaining from alcohol and smoking  Plan:  Aim for 2 Carb Choices per meal (30 grams) +/- 1 either way for breakfast Aim for 3 Carb Choices per meal (45 grams) +/- 1 either way from lunch and dinner Aim for 1-2 Carbs per snack Begin reading food labels for Total Carbohydrate and sugar grams of foods Consider  increasing your activity level by walking daily as tolerated Begin checking BG before breakfast and 2 hours after first bit of breakfast, lunch and dinner after  as directed by MD  Take medication  as directed by MD  Blood glucose monitor ordered: One Touch VerioFlex Blood glucose reading: 166 1hpp  Patient instructed to monitor glucose levels: FBS: 60 - <90 2 hour: <120  Patient received the following handouts:  Nutrition Diabetes and Pregnancy  Carbohydrate Counting List  Patient will be seen for follow-up as needed.

## 2015-08-24 ENCOUNTER — Other Ambulatory Visit (HOSPITAL_COMMUNITY): Payer: Self-pay

## 2015-08-25 ENCOUNTER — Other Ambulatory Visit (HOSPITAL_COMMUNITY): Payer: Self-pay

## 2015-08-25 ENCOUNTER — Encounter (HOSPITAL_COMMUNITY): Payer: Self-pay

## 2015-08-26 ENCOUNTER — Other Ambulatory Visit (HOSPITAL_COMMUNITY): Payer: Self-pay | Admitting: Maternal and Fetal Medicine

## 2015-08-26 ENCOUNTER — Encounter (HOSPITAL_COMMUNITY): Payer: Self-pay

## 2015-08-26 ENCOUNTER — Ambulatory Visit (HOSPITAL_COMMUNITY)
Admission: RE | Admit: 2015-08-26 | Discharge: 2015-08-26 | Disposition: A | Discharge status: Home / Self Care | Payer: Managed Care, Other (non HMO) | Source: Ambulatory Visit | Attending: Obstetrics and Gynecology | Admitting: Obstetrics and Gynecology

## 2015-08-26 DIAGNOSIS — O09292 Supervision of pregnancy with other poor reproductive or obstetric history, second trimester: Secondary | ICD-10-CM

## 2015-08-26 DIAGNOSIS — O24410 Gestational diabetes mellitus in pregnancy, diet controlled: Secondary | ICD-10-CM | POA: Diagnosis not present

## 2015-08-26 DIAGNOSIS — O09523 Supervision of elderly multigravida, third trimester: Secondary | ICD-10-CM | POA: Insufficient documentation

## 2015-08-26 DIAGNOSIS — Z3A29 29 weeks gestation of pregnancy: Secondary | ICD-10-CM | POA: Insufficient documentation

## 2015-08-26 DIAGNOSIS — O09293 Supervision of pregnancy with other poor reproductive or obstetric history, third trimester: Secondary | ICD-10-CM | POA: Insufficient documentation

## 2015-08-26 DIAGNOSIS — O2693 Pregnancy related conditions, unspecified, third trimester: Secondary | ICD-10-CM

## 2015-08-26 HISTORY — DX: Gestational diabetes mellitus in pregnancy, unspecified control: O24.419

## 2015-08-27 ENCOUNTER — Encounter: Payer: Managed Care, Other (non HMO) | Attending: Obstetrics and Gynecology | Admitting: *Deleted

## 2015-08-27 DIAGNOSIS — O99810 Abnormal glucose complicating pregnancy: Secondary | ICD-10-CM | POA: Diagnosis not present

## 2015-08-27 DIAGNOSIS — Z3A00 Weeks of gestation of pregnancy not specified: Secondary | ICD-10-CM | POA: Insufficient documentation

## 2015-09-23 ENCOUNTER — Other Ambulatory Visit (HOSPITAL_COMMUNITY): Payer: Self-pay | Admitting: Maternal and Fetal Medicine

## 2015-09-23 ENCOUNTER — Encounter (HOSPITAL_COMMUNITY): Payer: Self-pay

## 2015-09-23 ENCOUNTER — Ambulatory Visit (HOSPITAL_COMMUNITY)
Admission: RE | Admit: 2015-09-23 | Discharge: 2015-09-23 | Disposition: A | Discharge status: Home / Self Care | Payer: 59 | Source: Ambulatory Visit | Attending: Obstetrics and Gynecology | Admitting: Obstetrics and Gynecology

## 2015-09-23 VITALS — BP 134/84 | HR 102 | Wt 203.8 lb

## 2015-09-23 DIAGNOSIS — O09523 Supervision of elderly multigravida, third trimester: Secondary | ICD-10-CM

## 2015-09-23 DIAGNOSIS — O09293 Supervision of pregnancy with other poor reproductive or obstetric history, third trimester: Secondary | ICD-10-CM

## 2015-09-23 DIAGNOSIS — O24415 Gestational diabetes mellitus in pregnancy, controlled by oral hypoglycemic drugs: Secondary | ICD-10-CM

## 2015-09-23 DIAGNOSIS — Z3A33 33 weeks gestation of pregnancy: Secondary | ICD-10-CM

## 2015-09-23 DIAGNOSIS — O24410 Gestational diabetes mellitus in pregnancy, diet controlled: Secondary | ICD-10-CM

## 2015-09-23 DIAGNOSIS — O24419 Gestational diabetes mellitus in pregnancy, unspecified control: Secondary | ICD-10-CM

## 2015-09-23 DIAGNOSIS — O2693 Pregnancy related conditions, unspecified, third trimester: Secondary | ICD-10-CM

## 2015-10-12 LAB — OB RESULTS CONSOLE GBS: STREP GROUP B AG: POSITIVE

## 2015-10-16 ENCOUNTER — Encounter (HOSPITAL_COMMUNITY): Payer: Self-pay | Admitting: *Deleted

## 2015-10-16 ENCOUNTER — Inpatient Hospital Stay (HOSPITAL_COMMUNITY)
Admission: AD | Admit: 2015-10-16 | Discharge: 2015-10-16 | Disposition: A | Discharge status: Home / Self Care | Payer: 59 | Source: Ambulatory Visit | Attending: Obstetrics and Gynecology | Admitting: Obstetrics and Gynecology

## 2015-10-16 DIAGNOSIS — O163 Unspecified maternal hypertension, third trimester: Secondary | ICD-10-CM | POA: Diagnosis not present

## 2015-10-16 DIAGNOSIS — O133 Gestational [pregnancy-induced] hypertension without significant proteinuria, third trimester: Secondary | ICD-10-CM

## 2015-10-16 DIAGNOSIS — Z3A37 37 weeks gestation of pregnancy: Secondary | ICD-10-CM | POA: Diagnosis not present

## 2015-10-16 DIAGNOSIS — O139 Gestational [pregnancy-induced] hypertension without significant proteinuria, unspecified trimester: Secondary | ICD-10-CM

## 2015-10-16 LAB — COMPREHENSIVE METABOLIC PANEL
ALBUMIN: 2.7 g/dL — AB (ref 3.5–5.0)
ALT: 12 U/L — ABNORMAL LOW (ref 14–54)
AST: 18 U/L (ref 15–41)
Alkaline Phosphatase: 135 U/L — ABNORMAL HIGH (ref 38–126)
Anion gap: 8 (ref 5–15)
BUN: 7 mg/dL (ref 6–20)
CHLORIDE: 107 mmol/L (ref 101–111)
CO2: 22 mmol/L (ref 22–32)
Calcium: 9.2 mg/dL (ref 8.9–10.3)
Creatinine, Ser: 0.57 mg/dL (ref 0.44–1.00)
GFR calc Af Amer: >60 mL/min (ref >60)
GFR calc non Af Amer: >60 mL/min (ref >60)
GLUCOSE: 68 mg/dL (ref 65–99)
POTASSIUM: 4.3 mmol/L (ref 3.5–5.1)
Sodium: 137 mmol/L (ref 135–145)
Total Bilirubin: 0.3 mg/dL (ref 0.3–1.2)
Total Protein: 5.8 g/dL — ABNORMAL LOW (ref 6.5–8.1)

## 2015-10-16 LAB — CBC
HCT: 33.2 % — ABNORMAL LOW (ref 36.0–46.0)
Hemoglobin: 10.7 g/dL — ABNORMAL LOW (ref 12.0–15.0)
MCH: 25.9 pg — ABNORMAL LOW (ref 26.0–34.0)
MCHC: 32.2 g/dL (ref 30.0–36.0)
MCV: 80.4 fL (ref 78.0–100.0)
PLATELETS: 202 10*3/uL (ref 150–400)
RBC: 4.13 MIL/uL (ref 3.87–5.11)
RDW: 14.9 % (ref 11.5–15.5)
WBC: 9.7 10*3/uL (ref 4.0–10.5)

## 2015-10-16 LAB — PROTEIN / CREATININE RATIO, URINE
Creatinine, Urine: 59 mg/dL
Protein Creatinine Ratio: 0.17 mg/mg{Cre} — ABNORMAL HIGH (ref 0.00–0.15)
Total Protein, Urine: 10 mg/dL

## 2015-10-16 NOTE — MAU Note (Signed)
Went for normal appt, BP was elevated.  Had dull HA last couple days, had a nosebleed, 3#gain in 3 days

## 2015-10-16 NOTE — Discharge Instructions (Signed)
Hypertension During Pregnancy °Hypertension is also called high blood pressure. Blood pressure moves blood in your body. Sometimes, the force that moves the blood becomes too strong. When you are pregnant, this condition should be watched carefully. It can cause problems for you and your baby. °HOME CARE  °· Make and keep all of your doctor visits. °· Take medicine as told by your doctor. Tell your doctor about all medicines you take. °· Eat very little salt. °· Exercise regularly. °· Do not drink alcohol. °· Do not smoke. °· Do not have drinks with caffeine. °· Lie on your left side when resting. °· Your health care provider may ask you to take one low-dose aspirin (81mg) each day. °GET HELP RIGHT AWAY IF: °· You have bad belly (abdominal) pain. °· You have sudden puffiness (swelling) in the hands, ankles, or face. °· You gain 4 pounds (1.8 kilograms) or more in 1 week. °· You throw up (vomit) repeatedly. °· You have bleeding from the vagina. °· You do not feel the baby moving as much. °· You have a headache. °· You have blurred or double vision. °· You have muscle twitching or spasms. °· You have shortness of breath. °· You have blue fingernails and lips. °· You have blood in your pee (urine). °MAKE SURE YOU: °· Understand these instructions. °· Will watch your condition. °· Will get help right away if you are not doing well or get worse. °  °This information is not intended to replace advice given to you by your health care provider. Make sure you discuss any questions you have with your health care provider. °  °Document Released: 06/25/2010 Document Revised: 06/13/2014 Document Reviewed: 12/20/2012 °Elsevier Interactive Patient Education ©2016 Elsevier Inc. ° °

## 2015-10-16 NOTE — MAU Provider Note (Signed)
MAU HISTORY AND PHYSICAL  Chief Complaint:  Hypertension and Headache   [name] is a 40 y.o.  [phone] with IUP at [redacted]w[redacted]d presenting for Hypertension and Headache  Seen at clinic today for NST which was normal. BP elevated to 140s/90s with increased swelling and dull headache for 3 days and nosebleed. No new sob, no epigastric/ruq pain, no vision change. Otherwise feeling normally save for chronic nausea this pregnancy. No bleeding, frequent contractions, or LOF.   Past Medical History  Diagnosis Date  . Insomnia   . Headaches due to old head injury   . Depression   . Nausea   . Gestational diabetes     Past Surgical History  Procedure Laterality Date  . None to date      08/18/14  . Colonoscopy N/A 09/12/2014    SLF: 1. No obvious source for diarrhea/ abdominal pain identified. IIt's most likely due to IBS-D. 2. Mild diverticulosis in the right colon 3. Moderate sized external hemorrhoids.     Family History  Problem Relation Age of Onset  . Colon cancer Neg Hx     Social History  Substance Use Topics  . Smoking status: Former Games developer  . Smokeless tobacco: Never Used  . Alcohol Use: Yes     Comment: rare social     Allergies  Allergen Reactions  . Amoxicillin-Pot Clavulanate Other (See Comments)    Makes colon bleed  . Cefuroxime Axetil Other (See Comments)    GI upset makes colon bleed  . Cephalosporins     GI upset  . Levaquin [Levofloxacin In D5w] Other (See Comments)    Colitis, GI upset, causes colon to bleed  . Temazepam Diarrhea  . Latex Rash and Other (See Comments)    Creates water blisters    Prescriptions prior to admission  Medication Sig Dispense Refill Last Dose  . ACETAMINOPHEN-BUTALBITAL 50-325 MG TABS TAKE 1-2 TABLETS EVERY 4-6 HOURS AS DIRECTED FOR HEADACHE 60 each 5 10/15/2015 at Unknown time  . glyBURIDE (DIABETA) 2.5 MG tablet Take 2.5 mg by mouth at bedtime.   10/15/2015 at Unknown time  . omeprazole (PRILOSEC) 40 MG capsule TAKE 1  CAPSULE (40 MG TOTAL) BY MOUTH DAILY AS NEEDED FOR ACID REFLUX 30 capsule 5 10/16/2015 at Unknown time  . Prenatal Vit-Min-FA-Fish Oil ([facility] PRENATAL GUMMY) 0.4-113.5 MG CHEW Chew 1 tablet by mouth daily.   10/15/2015 at Unknown time  . sertraline (ZOLOFT) 100 MG tablet TAKE 1 AND 1/2 TABLET BY MOUTH EVERY DAY 45 tablet 5 10/16/2015 at Unknown time  . Eluxadoline (VIBERZI) 100 MG TABS Take 100 mg by mouth 2 (two) times daily. (Patient not taking: Reported on 09/23/2015) 60 tablet 5 Not Taking    Review of Systems - Negative except for what is mentioned in HPI.  Physical Exam  Blood pressure 130/82, pulse 95, temperature 98.1 F (36.7 C), temperature source Oral, resp. rate 18, weight 209 lb (94.802 kg), last menstrual period 01/30/2015. GENERAL: Well-developed, well-nourished female in no acute distress.  LUNGS: Clear to auscultation bilaterally.  HEART: Regular rate and rhythm. ABDOMEN: Soft, nontender, nondistended, gravid.  EXTREMITIES: Nontender, edema to mid-calves, 2+ distal pulses. Cervical Exam: deferred FHT:  140/mod/+a/-d Contractions: none   Labs: Results for orders placed or performed during the hospital encounter of 10/16/15 (from the past 24 hour(s))  Protein / creatinine ratio, urine   Collection Time: 10/16/15 12:40 PM  Result Value Ref Range   Creatinine, Urine 59.00 mg/dL   Total Protein, Urine 10  mg/dL   Protein Creatinine Ratio 0.17 (H) 0.00 - 0.15 mg/mg[Cre]  CBC   Collection Time: 10/16/15  1:10 PM  Result Value Ref Range   WBC 9.7 4.0 - 10.5 K/uL   RBC 4.13 3.87 - 5.11 MIL/uL   Hemoglobin 10.7 (L) 12.0 - 15.0 g/dL   HCT 16.133.2 (L) 09.636.0 - 04.546.0 %   MCV 80.4 78.0 - 100.0 fL   MCH 25.9 (L) 26.0 - 34.0 pg   MCHC 32.2 30.0 - 36.0 g/dL   RDW 40.914.9 81.111.5 - 91.415.5 %   Platelets 202 150 - 400 K/uL  Comprehensive metabolic panel   Collection Time: 10/16/15  1:10 PM  Result Value Ref Range   Sodium 137 135 - 145 mmol/L   Potassium 4.3 3.5 - 5.1 mmol/L   Chloride 107 101 -  111 mmol/L   CO2 22 22 - 32 mmol/L   Glucose, Bld 68 65 - 99 mg/dL   BUN 7 6 - 20 mg/dL   Creatinine, Ser 7.820.57 0.44 - 1.00 mg/dL   Calcium 9.2 8.9 - 95.610.3 mg/dL   Total Protein 5.8 (L) 6.5 - 8.1 g/dL   Albumin 2.7 (L) 3.5 - 5.0 g/dL   AST 18 15 - 41 U/L   ALT 12 (L) 14 - 54 U/L   Alkaline Phosphatase 135 (H) 38 - 126 U/L   Total Bilirubin 0.3 0.3 - 1.2 mg/dL   GFR calc non Af Amer >60 >60 mL/min   GFR calc Af Amer >60 >60 mL/min   Anion gap 8 5 - 15    Imaging Studies:  [place] Ob Follow Up  09/23/2015  OBSTETRICAL ULTRASOUND: This exam was performed within a [facility]. The OB US report was generated in the AS system, and faxed to the ordering physician.  This report is available in the [facility] [employer] PACS. See the [facility] Obstetric US report via the Image Link.   Assessment: [name] is  40 y.o. [id number] at 7062w0d presenting for hypertension eval. Unlikely preeclampsia or ghtn. Initial bp mildly elevated, but all subsequents well within normal limits. Mild headache but otherwise asymptomatic. Labs unremarkable. NST reactive. Discussed w/ Dr. Henderson Cloudomblin.  Plan: - d/c home with preeclampsia/hellp return precautions and clinic f/u on Monday  [name] [facility] 5/12/20171:59 PM

## 2015-10-21 ENCOUNTER — Ambulatory Visit (HOSPITAL_COMMUNITY): Payer: Managed Care, Other (non HMO)

## 2015-10-27 ENCOUNTER — Encounter (HOSPITAL_COMMUNITY): Payer: Self-pay

## 2015-10-27 ENCOUNTER — Inpatient Hospital Stay (HOSPITAL_COMMUNITY)
Admission: AD | Admit: 2015-10-27 | Discharge: 2015-10-29 | DRG: 775 | Disposition: A | Discharge status: Home / Self Care | Payer: 59 | Source: Ambulatory Visit | Attending: Obstetrics and Gynecology | Admitting: Obstetrics and Gynecology

## 2015-10-27 DIAGNOSIS — O134 Gestational [pregnancy-induced] hypertension without significant proteinuria, complicating childbirth: Principal | ICD-10-CM | POA: Diagnosis present

## 2015-10-27 DIAGNOSIS — Z87891 Personal history of nicotine dependence: Secondary | ICD-10-CM | POA: Diagnosis not present

## 2015-10-27 DIAGNOSIS — O139 Gestational [pregnancy-induced] hypertension without significant proteinuria, unspecified trimester: Secondary | ICD-10-CM | POA: Diagnosis present

## 2015-10-27 DIAGNOSIS — Z9889 Other specified postprocedural states: Secondary | ICD-10-CM | POA: Diagnosis not present

## 2015-10-27 DIAGNOSIS — O24429 Gestational diabetes mellitus in childbirth, unspecified control: Secondary | ICD-10-CM | POA: Diagnosis present

## 2015-10-27 LAB — URIC ACID: Uric Acid, Serum: 4.8 mg/dL (ref 2.3–6.6)

## 2015-10-27 LAB — COMPREHENSIVE METABOLIC PANEL
ALBUMIN: 2.8 g/dL — AB (ref 3.5–5.0)
ALK PHOS: 145 U/L — AB (ref 38–126)
ALT: 13 U/L — ABNORMAL LOW (ref 14–54)
ANION GAP: 9 (ref 5–15)
AST: 20 U/L (ref 15–41)
BUN: 11 mg/dL (ref 6–20)
CALCIUM: 8.8 mg/dL — AB (ref 8.9–10.3)
CO2: 20 mmol/L — AB (ref 22–32)
Chloride: 104 mmol/L (ref 101–111)
Creatinine, Ser: 0.58 mg/dL (ref 0.44–1.00)
GFR calc Af Amer: >60 mL/min (ref >60)
GFR calc non Af Amer: >60 mL/min (ref >60)
GLUCOSE: 91 mg/dL (ref 65–99)
Potassium: 4.1 mmol/L (ref 3.5–5.1)
SODIUM: 133 mmol/L — AB (ref 135–145)
Total Bilirubin: 0.2 mg/dL — ABNORMAL LOW (ref 0.3–1.2)
Total Protein: 5.9 g/dL — ABNORMAL LOW (ref 6.5–8.1)

## 2015-10-27 LAB — CBC
HEMATOCRIT: 33.8 % — AB (ref 36.0–46.0)
HEMOGLOBIN: 10.8 g/dL — AB (ref 12.0–15.0)
MCH: 25.7 pg — AB (ref 26.0–34.0)
MCHC: 32 g/dL (ref 30.0–36.0)
MCV: 80.5 fL (ref 78.0–100.0)
Platelets: 251 10*3/uL (ref 150–400)
RBC: 4.2 MIL/uL (ref 3.87–5.11)
RDW: 15.3 % (ref 11.5–15.5)
WBC: 11.4 10*3/uL — ABNORMAL HIGH (ref 4.0–10.5)

## 2015-10-27 LAB — TYPE AND SCREEN
ABO/RH(D): A POS
Antibody Screen: NEGATIVE

## 2015-10-27 MED ORDER — SERTRALINE HCL 50 MG PO TABS
150.0000 mg | ORAL_TABLET | Freq: Every day | ORAL | Status: DC
  Administered 2015-10-29: 150 mg via ORAL
  Filled 2015-10-27 (×3): qty 1

## 2015-10-27 MED ORDER — OXYCODONE-ACETAMINOPHEN 5-325 MG PO TABS: 1.0000 | ORAL_TABLET | ORAL | Status: DC | PRN

## 2015-10-27 MED ORDER — LACTATED RINGERS IV SOLN
INTRAVENOUS | Status: DC
  Administered 2015-10-27 – 2015-10-28 (×3): via INTRAVENOUS

## 2015-10-27 MED ORDER — TERBUTALINE SULFATE 1 MG/ML IJ SOLN
0.2500 mg | Freq: Once | INTRAMUSCULAR | Status: DC | PRN
  Filled 2015-10-27: qty 1

## 2015-10-27 MED ORDER — ZOLPIDEM TARTRATE 5 MG PO TABS: 5.0000 mg | ORAL_TABLET | Freq: Every evening | ORAL | Status: DC | PRN

## 2015-10-27 MED ORDER — CITRIC ACID-SODIUM CITRATE 334-500 MG/5ML PO SOLN
30.0000 mL | ORAL | Status: DC | PRN
  Filled 2015-10-27: qty 30

## 2015-10-27 MED ORDER — OXYTOCIN 40 UNITS IN LACTATED RINGERS INFUSION - SIMPLE MED: 2.5000 [IU]/h | INTRAVENOUS | Status: DC

## 2015-10-27 MED ORDER — FLEET ENEMA 7-19 GM/118ML RE ENEM: 1.0000 | ENEMA | RECTAL | Status: DC | PRN

## 2015-10-27 MED ORDER — MISOPROSTOL 25 MCG QUARTER TABLET
25.0000 ug | ORAL_TABLET | ORAL | Status: DC | PRN
  Administered 2015-10-27: 25 ug via VAGINAL
  Filled 2015-10-27: qty 1
  Filled 2015-10-27: qty 0.25

## 2015-10-27 MED ORDER — OXYCODONE-ACETAMINOPHEN 5-325 MG PO TABS: 2.0000 | ORAL_TABLET | ORAL | Status: DC | PRN

## 2015-10-27 MED ORDER — LIDOCAINE HCL (PF) 1 % IJ SOLN
30.0000 mL | INTRAMUSCULAR | Status: DC | PRN
  Filled 2015-10-27: qty 30

## 2015-10-27 MED ORDER — CLINDAMYCIN PHOSPHATE 900 MG/50ML IV SOLN
900.0000 mg | Freq: Three times a day (TID) | INTRAVENOUS | Status: DC
  Administered 2015-10-28 (×2): 900 mg via INTRAVENOUS
  Filled 2015-10-27 (×3): qty 50

## 2015-10-27 MED ORDER — BUTORPHANOL TARTRATE 1 MG/ML IJ SOLN: 1.0000 mg | INTRAMUSCULAR | Status: DC | PRN

## 2015-10-27 MED ORDER — ONDANSETRON HCL 4 MG/2ML IJ SOLN
4.0000 mg | Freq: Four times a day (QID) | INTRAMUSCULAR | Status: DC | PRN
  Administered 2015-10-28: 4 mg via INTRAVENOUS
  Filled 2015-10-27: qty 2

## 2015-10-27 MED ORDER — LIDOCAINE HCL (PF) 1 % IJ SOLN
0.3000 mL | Freq: Once | INTRAMUSCULAR | Status: AC
  Administered 2015-10-27: 0.3 mL via SUBCUTANEOUS

## 2015-10-27 MED ORDER — ACETAMINOPHEN 325 MG PO TABS
650.0000 mg | ORAL_TABLET | ORAL | Status: DC | PRN
  Administered 2015-10-28 – 2015-10-29 (×2): 650 mg via ORAL
  Filled 2015-10-27 (×3): qty 2

## 2015-10-27 MED ORDER — LACTATED RINGERS IV SOLN
500.0000 mL | INTRAVENOUS | Status: DC | PRN
Start: 2015-10-27 — End: 2015-10-29

## 2015-10-27 MED ORDER — OXYTOCIN BOLUS FROM INFUSION
500.0000 mL | INTRAVENOUS | Status: DC
  Administered 2015-10-28: 500 mL via INTRAVENOUS

## 2015-10-27 NOTE — Anesthesia Pain Management Evaluation Note (Signed)
  CRNA Pain Management Visit Note  Patient: [name], 40 y.o., female  "Hello I am a member of the anesthesia team at [facility]. We have an anesthesia team available at all times to provide care throughout the hospital, including epidural management and anesthesia for C-section. I don't know your plan for the delivery whether it a natural birth, water birth, IV sedation, nitrous supplementation, doula or epidural, but we want to meet your pain goals."   1.Was your pain managed to your expectations on prior hospitalizations?   Yes   2.What is your expectation for pain management during this hospitalization?     Epidural  3.How can we help you reach that goal? epidural  Record the patient's initial score and the patient's pain goal.   Pain: 0  Pain Goal: 7 [facility] wants you to be able to say your pain was always managed very well.  [name] 10/27/2015

## 2015-10-28 ENCOUNTER — Inpatient Hospital Stay (HOSPITAL_COMMUNITY): Payer: 59 | Admitting: Anesthesiology

## 2015-10-28 ENCOUNTER — Encounter (HOSPITAL_COMMUNITY): Payer: Self-pay | Admitting: Anesthesiology

## 2015-10-28 LAB — CBC
HEMATOCRIT: 30.5 % — AB (ref 36.0–46.0)
HEMOGLOBIN: 9.8 g/dL — AB (ref 12.0–15.0)
MCH: 26.1 pg (ref 26.0–34.0)
MCHC: 32.1 g/dL (ref 30.0–36.0)
MCV: 81.1 fL (ref 78.0–100.0)
Platelets: 198 10*3/uL (ref 150–400)
RBC: 3.76 MIL/uL — AB (ref 3.87–5.11)
RDW: 15.4 % (ref 11.5–15.5)
WBC: 11.2 10*3/uL — ABNORMAL HIGH (ref 4.0–10.5)

## 2015-10-28 LAB — GLUCOSE, CAPILLARY: GLUCOSE-CAPILLARY: 73 mg/dL (ref 65–99)

## 2015-10-28 LAB — RPR: RPR: NONREACTIVE

## 2015-10-28 LAB — ABO/RH: ABO/RH(D): A POS

## 2015-10-28 MED ORDER — FLEET ENEMA 7-19 GM/118ML RE ENEM: 1.0000 | ENEMA | Freq: Every day | RECTAL | Status: DC | PRN

## 2015-10-28 MED ORDER — EPHEDRINE 5 MG/ML INJ
10.0000 mg | INTRAVENOUS | Status: DC | PRN
  Filled 2015-10-28: qty 2

## 2015-10-28 MED ORDER — PHENYLEPHRINE 40 MCG/ML (10ML) SYRINGE FOR IV PUSH (FOR BLOOD PRESSURE SUPPORT)
80.0000 ug | PREFILLED_SYRINGE | INTRAVENOUS | Status: DC | PRN
  Filled 2015-10-28: qty 5

## 2015-10-28 MED ORDER — DIPHENHYDRAMINE HCL 50 MG/ML IJ SOLN: 12.5000 mg | INTRAMUSCULAR | Status: DC | PRN

## 2015-10-28 MED ORDER — TETANUS-DIPHTH-ACELL PERTUSSIS 5-2.5-18.5 LF-MCG/0.5 IM SUSP
0.5000 mL | Freq: Once | INTRAMUSCULAR | Status: DC
Start: 2015-10-29 — End: 2015-10-29

## 2015-10-28 MED ORDER — FENTANYL 2.5 MCG/ML BUPIVACAINE 1/10 % EPIDURAL INFUSION (WH - ANES)
14.0000 mL/h | INTRAMUSCULAR | Status: DC | PRN
  Administered 2015-10-28: 14 mL/h via EPIDURAL
  Filled 2015-10-28: qty 125

## 2015-10-28 MED ORDER — DIPHENHYDRAMINE HCL 25 MG PO CAPS: 25.0000 mg | ORAL_CAPSULE | Freq: Four times a day (QID) | ORAL | Status: DC | PRN

## 2015-10-28 MED ORDER — BISACODYL 10 MG RE SUPP: 10.0000 mg | Freq: Every day | RECTAL | Status: DC | PRN

## 2015-10-28 MED ORDER — PRENATAL MULTIVITAMIN CH
1.0000 | ORAL_TABLET | Freq: Every day | ORAL | Status: DC
  Administered 2015-10-29: 1 via ORAL
  Filled 2015-10-28: qty 1

## 2015-10-28 MED ORDER — OXYTOCIN 40 UNITS IN LACTATED RINGERS INFUSION - SIMPLE MED: 1.0000 m[IU]/min | INTRAVENOUS | Status: DC

## 2015-10-28 MED ORDER — ACETAMINOPHEN 325 MG PO TABS: 650.0000 mg | ORAL_TABLET | ORAL | Status: DC | PRN

## 2015-10-28 MED ORDER — WITCH HAZEL-GLYCERIN EX PADS: 1.0000 "application " | MEDICATED_PAD | CUTANEOUS | Status: DC | PRN

## 2015-10-28 MED ORDER — LIDOCAINE HCL (PF) 1 % IJ SOLN
INTRAMUSCULAR | Status: DC | PRN
  Administered 2015-10-28: 8 mL via EPIDURAL
  Administered 2015-10-28: 7 mL via EPIDURAL

## 2015-10-28 MED ORDER — ONDANSETRON HCL 4 MG PO TABS: 4.0000 mg | ORAL_TABLET | ORAL | Status: DC | PRN

## 2015-10-28 MED ORDER — IBUPROFEN 600 MG PO TABS
600.0000 mg | ORAL_TABLET | Freq: Four times a day (QID) | ORAL | Status: DC
  Administered 2015-10-28 – 2015-10-29 (×4): 600 mg via ORAL
  Filled 2015-10-28 (×4): qty 1

## 2015-10-28 MED ORDER — SIMETHICONE 80 MG PO CHEW: 80.0000 mg | CHEWABLE_TABLET | ORAL | Status: DC | PRN

## 2015-10-28 MED ORDER — LACTATED RINGERS IV SOLN
500.0000 mL | Freq: Once | INTRAVENOUS | Status: AC
  Administered 2015-10-28: 500 mL via INTRAVENOUS

## 2015-10-28 MED ORDER — SENNOSIDES-DOCUSATE SODIUM 8.6-50 MG PO TABS
2.0000 | ORAL_TABLET | ORAL | Status: DC
  Administered 2015-10-29: 2 via ORAL
  Filled 2015-10-28: qty 2

## 2015-10-28 MED ORDER — MEDROXYPROGESTERONE ACETATE 150 MG/ML IM SUSP: 150.0000 mg | INTRAMUSCULAR | Status: DC | PRN

## 2015-10-28 MED ORDER — DIBUCAINE 1 % RE OINT: 1.0000 "application " | TOPICAL_OINTMENT | RECTAL | Status: DC | PRN

## 2015-10-28 MED ORDER — ONDANSETRON HCL 4 MG/2ML IJ SOLN: 4.0000 mg | INTRAMUSCULAR | Status: DC | PRN

## 2015-10-28 MED ORDER — TERBUTALINE SULFATE 1 MG/ML IJ SOLN
0.2500 mg | Freq: Once | INTRAMUSCULAR | Status: DC | PRN
  Filled 2015-10-28: qty 1

## 2015-10-28 MED ORDER — PHENYLEPHRINE 40 MCG/ML (10ML) SYRINGE FOR IV PUSH (FOR BLOOD PRESSURE SUPPORT)
80.0000 ug | PREFILLED_SYRINGE | INTRAVENOUS | Status: DC | PRN
  Filled 2015-10-28: qty 10
  Filled 2015-10-28: qty 5

## 2015-10-28 MED ORDER — MEASLES, MUMPS & RUBELLA VAC ~~LOC~~ INJ: 0.5000 mL | INJECTION | Freq: Once | SUBCUTANEOUS | Status: DC

## 2015-10-28 MED ORDER — ZOLPIDEM TARTRATE 5 MG PO TABS: 5.0000 mg | ORAL_TABLET | Freq: Every evening | ORAL | Status: DC | PRN

## 2015-10-28 MED ORDER — OXYTOCIN 40 UNITS IN LACTATED RINGERS INFUSION - SIMPLE MED
1.0000 m[IU]/min | INTRAVENOUS | Status: DC
  Administered 2015-10-28: 1 m[IU]/min via INTRAVENOUS
  Filled 2015-10-28: qty 1000

## 2015-10-28 MED ORDER — BENZOCAINE-MENTHOL 20-0.5 % EX AERO: 1.0000 "application " | INHALATION_SPRAY | CUTANEOUS | Status: DC | PRN

## 2015-10-28 MED ORDER — COCONUT OIL OIL: 1.0000 "application " | TOPICAL_OIL | Status: DC | PRN

## 2015-10-28 NOTE — Lactation Note (Signed)
This note was copied from a baby's chart. Lactation Consultation Note Initial visit at 6 hours of age.  RN request assist with fitting of flange for mom to pump and bottle feed per her request.  Mom has 3 older children that she didn't breastfeed  Mom has large breasts with large tough non compressible areolas.  Mom using #27 flange and increased to #30 with DEBP.  LC noted little breast movement with pump as mom has very tough breast tissue.  Mom denies discomfort with pumping.  LC encouraged mom to watch flange size and call for assist if any problems or pain with pumping.  Encouraged mom to pump every 3 hours or 8X/24 hours.  Discussed power pumping with mom to increase/establish a good supply. [facility] resources given and discussed. Mom to call for assist as needed.    Patient Name: Marie Daria PasturesCasey Bednarski ZOXWR'UToday's Date: 10/28/2015 Reason for consult: Initial assessment;Difficult latch   Maternal Data Formula Feeding for Exclusion: Yes Reason for exclusion: Mother's choice to formula and breast feed on admission Has patient been taught Hand Expression?: Yes Does the patient have breastfeeding experience prior to this delivery?: No  Feeding Feeding Type: Bottle Fed - Formula Nipple Type: Slow - flow Length of feed: 0 min  LATCH Score/Interventions                      Lactation Tools Discussed/Used Pump Review: Setup, frequency, and cleaning Initiated by:: JS/ MBU RN Date initiated:: 10/28/15   Consult Status Consult Status: Follow-up Date: 10/29/15 Follow-up type: In-patient    Dametrius Sanjuan, Arvella MerlesJana Lynn 10/28/2015, 10:56 PM

## 2015-10-28 NOTE — Plan of Care (Signed)
Problem: Urinary Elimination: Goal: Ability to reestablish a normal urinary elimination pattern will improve Outcome: Progressing Due to void   

## 2015-10-28 NOTE — Anesthesia Preprocedure Evaluation (Signed)
Anesthesia Evaluation  Patient identified by MRN, date of birth, ID band Patient awake    Reviewed: Allergy & Precautions, H&P , NPO status , Patient's Chart, lab work & pertinent test results  Airway Mallampati: II  TM Distance: >3 FB Neck ROM: full    Dental no notable dental hx.    Pulmonary former smoker,    Pulmonary exam normal        Cardiovascular Normal cardiovascular exam     Neuro/Psych PSYCHIATRIC DISORDERS Depression    GI/Hepatic Neg liver ROS, GERD  Medicated and Controlled,  Endo/Other  negative endocrine ROSdiabetes, Gestational  Renal/GU negative Renal ROS     Musculoskeletal   Abdominal (+) + obese,   Peds  Hematology negative hematology ROS (+)   Anesthesia Other Findings   Reproductive/Obstetrics (+) Pregnancy                             Anesthesia Physical Anesthesia Plan  ASA: II  Anesthesia Plan: Epidural   Post-op Pain Management:    Induction:   Airway Management Planned:   Additional Equipment:   Intra-op Plan:   Post-operative Plan:   Informed Consent: I have reviewed the patients History and Physical, chart, labs and discussed the procedure including the risks, benefits and alternatives for the proposed anesthesia with the patient or authorized representative who has indicated his/her understanding and acceptance.     Plan Discussed with:   Anesthesia Plan Comments:         Anesthesia Quick Evaluation

## 2015-10-28 NOTE — H&P (Signed)
[name] is a 40 y.o. G 5 P 3013 at 2338 w 5 days presents for induction secondary to gestational hypertension. She has noticed dramatic increase in swelling in face or hands over past 2 weeks.  She also has had increase in BPs - some 140s/ 90s at more than one visit. Yesterday , in office BP 140/88. She also complained of feeling "dizzy" all the time. She is A2 DM on Glyburide. History OB History    Gravida Para Term Preterm AB TAB SAB Ectopic Multiple Living   5 3 3  0 1 0 1 0 0 3     Past Medical History  Diagnosis Date  . Insomnia   . Headaches due to old head injury   . Depression   . Nausea   . Gestational diabetes    Past Surgical History  Procedure Laterality Date  . None to date      08/18/14  . Colonoscopy N/A 09/12/2014    SLF: 1. No obvious source for diarrhea/ abdominal pain identified. IIt's most likely due to IBS-D. 2. Mild diverticulosis in the right colon 3. Moderate sized external hemorrhoids.    Family History: family history is negative for Colon cancer. Social History:  reports that she has quit smoking. She has never used smokeless tobacco. She reports that she drinks alcohol. She reports that she does not use illicit drugs.   Prenatal Transfer Tool  Maternal Diabetes:YES SHE is on GLYBURIDE Genetic Screening: Normal Maternal Ultrasounds/Referrals: Normal Fetal Ultrasounds or other Referrals:  None Maternal Substance Abuse: NO Significant Maternal Medications:  None Significant Maternal Lab Results:  None Other Comments:  None  Review of Systems  All other systems reviewed and are negative.   Dilation: 2 Effacement (%): 70 Station: -2 Exam by:: dr [name] Blood pressure 128/82, pulse 98, temperature 98 F (36.7 C), temperature source Oral, resp. rate 16, height 5\' 5"  (1.651 m), weight 94.802 kg (209 lb), last menstrual period 01/30/2015, SpO2 100 %. Maternal Exam:  Uterine Assessment: Contraction strength is moderate.  Contraction frequency is  regular.   Abdomen: Fetal presentation: vertex     Fetal Exam Fetal State Assessment: Category I - tracings are normal.     Physical Exam  Nursing note and vitals reviewed. Constitutional: She appears well-developed and well-nourished.  HENT:  Head: Normocephalic.  Eyes: Pupils are equal, round, and reactive to light.  Neck: Normal range of motion.  Cardiovascular: Normal rate and regular rhythm.   Respiratory: Effort normal.   Results for orders placed or performed during the hospital encounter of 10/27/15 (from the past 24 hour(s))  CBC     Status: Abnormal   Collection Time: 10/27/15  7:05 PM  Result Value Ref Range   WBC 11.4 (H) 4.0 - 10.5 K/uL   RBC 4.20 3.87 - 5.11 MIL/uL   Hemoglobin 10.8 (L) 12.0 - 15.0 g/dL   HCT 16.133.8 (L) 09.636.0 - 04.546.0 %   MCV 80.5 78.0 - 100.0 fL   MCH 25.7 (L) 26.0 - 34.0 pg   MCHC 32.0 30.0 - 36.0 g/dL   RDW 40.915.3 81.111.5 - 91.415.5 %   Platelets 251 150 - 400 K/uL  Type and screen [facility]     Status: None   Collection Time: 10/27/15  7:05 PM  Result Value Ref Range   ABO/RH(D) A POS    Antibody Screen NEG    Sample Expiration 10/30/2015   RPR     Status: None   Collection Time:  10/27/15  7:05 PM  Result Value Ref Range   RPR Ser Ql Non Reactive Non Reactive  Comprehensive metabolic panel     Status: Abnormal   Collection Time: 10/27/15  7:05 PM  Result Value Ref Range   Sodium 133 (L) 135 - 145 mmol/L   Potassium 4.1 3.5 - 5.1 mmol/L   Chloride 104 101 - 111 mmol/L   CO2 20 (L) 22 - 32 mmol/L   Glucose, Bld 91 65 - 99 mg/dL   BUN 11 6 - 20 mg/dL   Creatinine, Ser 1.47 0.44 - 1.00 mg/dL   Calcium 8.8 (L) 8.9 - 10.3 mg/dL   Total Protein 5.9 (L) 6.5 - 8.1 g/dL   Albumin 2.8 (L) 3.5 - 5.0 g/dL   AST 20 15 - 41 U/L   ALT 13 (L) 14 - 54 U/L   Alkaline Phosphatase 145 (H) 38 - 126 U/L   Total Bilirubin 0.2 (L) 0.3 - 1.2 mg/dL   GFR calc non Af Amer >60 >60 mL/min   GFR calc Af Amer >60 >60 mL/min   Anion gap 9 5 -  15  Uric acid     Status: None   Collection Time: 10/27/15  7:05 PM  Result Value Ref Range   Uric Acid, Serum 4.8 2.3 - 6.6 mg/dL  ABO/Rh     Status: None   Collection Time: 10/27/15  7:05 PM  Result Value Ref Range   ABO/RH(D) A POS   CBC     Status: Abnormal   Collection Time: 10/28/15  8:25 AM  Result Value Ref Range   WBC 11.2 (H) 4.0 - 10.5 K/uL   RBC 3.76 (L) 3.87 - 5.11 MIL/uL   Hemoglobin 9.8 (L) 12.0 - 15.0 g/dL   HCT 82.9 (L) 56.2 - 13.0 %   MCV 81.1 78.0 - 100.0 fL   MCH 26.1 26.0 - 34.0 pg   MCHC 32.1 30.0 - 36.0 g/dL   RDW 86.5 78.4 - 69.6 %   Platelets 198 150 - 400 K/uL     Prenatal labs: ABO, Rh: --/--/A POS, A POS (05/23 1905) Antibody: NEG (05/23 1905) Rubella: Immune (10/26 0000) RPR: Non Reactive (05/23 1905)  HBsAg: Negative (10/26 0000)  HIV: Non-reactive (10/26 0000)  GBS: Positive (05/08 0000)   Assessment/Plan: IUP at term GDM Gestational Hypertension Pitocin prn Anticipate NSVD GBBS + - antibiotics for prophylaxis   [name] L 10/28/2015, 9:26 AM

## 2015-10-28 NOTE — Anesthesia Procedure Notes (Signed)
Epidural Patient location during procedure: OB Start time: 10/28/2015 8:50 AM End time: 10/28/2015 8:54 AM  Staffing Anesthesiologist: Leilani AbleHATCHETT, Claborn Janusz Performed by: anesthesiologist   Preanesthetic Checklist Completed: patient identified, surgical consent, pre-op evaluation, timeout performed, IV checked, risks and benefits discussed and monitors and equipment checked  Epidural Patient position: sitting Prep: site prepped and draped and DuraPrep Patient monitoring: continuous pulse ox and blood pressure Approach: midline Location: L3-L4 Injection technique: LOR air  Needle:  Needle type: Tuohy  Needle gauge: 17 G Needle length: 9 cm and 9 Needle insertion depth: 6 cm Catheter type: closed end flexible Catheter size: 19 Gauge Catheter at skin depth: 11 cm Test dose: negative  Assessment Events: blood not aspirated, injection not painful, no injection resistance, negative IV test and no paresthesia  Additional Notes Reason for block:procedure for pain

## 2015-10-29 LAB — CBC
HCT: 31.1 % — ABNORMAL LOW (ref 36.0–46.0)
HEMOGLOBIN: 10.2 g/dL — AB (ref 12.0–15.0)
MCH: 26.5 pg (ref 26.0–34.0)
MCHC: 32.8 g/dL (ref 30.0–36.0)
MCV: 80.8 fL (ref 78.0–100.0)
Platelets: 198 10*3/uL (ref 150–400)
RBC: 3.85 MIL/uL — AB (ref 3.87–5.11)
RDW: 15.7 % — ABNORMAL HIGH (ref 11.5–15.5)
WBC: 11.4 10*3/uL — ABNORMAL HIGH (ref 4.0–10.5)

## 2015-10-29 LAB — GLUCOSE, CAPILLARY: GLUCOSE-CAPILLARY: 87 mg/dL (ref 65–99)

## 2015-10-29 NOTE — Discharge Summary (Signed)
Obstetric Discharge Summary Reason for Admission: induction of labor Prenatal Procedures: none Intrapartum Procedures: spontaneous vaginal delivery Postpartum Procedures: none Complications-Operative and Postpartum: none HEMOGLOBIN  Date Value Ref Range Status  10/29/2015 10.2* 12.0 - 15.0 g/dL Final   HCT  Date Value Ref Range Status  10/29/2015 31.1* 36.0 - 46.0 % Final    Physical Exam:  General: alert, cooperative, appears older than stated age and no distress Lochia: appropriate Uterine Fundus: firm Incision: healing well DVT Evaluation: No evidence of DVT seen on physical exam.  Discharge Diagnoses: Term Pregnancy-delivered  Discharge Information: Date: 10/29/2015 Activity: pelvic rest Diet: routine Medications: Ibuprofen Condition: stable Instructions: refer to practice specific booklet Discharge to: home   Newborn Data: Live born female  Birth Weight: 6 lb 1.4 oz (2761 g) APGAR: 9, 9  Home with mother.  [name] C 10/29/2015, 2:51 PM

## 2015-10-29 NOTE — Lactation Note (Addendum)
This note was copied from a baby's chart. Lactation Consultation Note  Patient Name: [name] UJWJX'BToday's Date: 10/29/2015 Reason for consult: Follow-up assessment Baby at 25 hr of life and dyad set for D/C today. Mom only wants to pump to feed, she no longer wants to latch baby. She is worried about her milk "coming in". She has only pumped 1 time today. She plans to go home and Power Pump after getting the rest of her family settled in for the night. Discussed pumping frequency and breast changes. She is aware of engorgement/mastitis prevention and treatment. She is aware of OP services and support group. Changed formula from Alimentum to Similac Advanced until mom is able to provide breast milk.   Maternal Data    Feeding    LATCH Score/Interventions                      Lactation Tools Discussed/Used     Consult Status Consult Status: Complete    [name] 10/29/2015, 5:30 PM

## 2015-10-29 NOTE — Progress Notes (Signed)
Post Partum Day 1 Subjective: no complaints, up ad lib, voiding and tolerating PO  Objective: Blood pressure 131/81, pulse 92, temperature 98.1 F (36.7 C), temperature source Oral, resp. rate 18, height 5\' 5"  (1.651 m), weight 209 lb (94.802 kg), last menstrual period 01/30/2015, SpO2 100 %, unknown if currently breastfeeding.  Physical Exam:  General: alert, cooperative, appears stated age and no distress Lochia: appropriate Uterine Fundus: firm Incision: healing well DVT Evaluation: No evidence of DVT seen on physical exam.   Recent Labs  10/28/15 0825 10/29/15 0555  HGB 9.8* 10.2*  HCT 30.5* 31.1*    Assessment/Plan: Plan for discharge tomorrow and Breastfeeding   LOS: 2 days   [name] C 10/29/2015, 9:01 AM

## 2015-10-29 NOTE — Progress Notes (Signed)
CLINICAL SOCIAL WORK MATERNAL/CHILD NOTE  Patient Details  Name: [name] MRN: [id number] Date of Birth: 10/28/2013  Date: 10/29/2015  Clinical Social Worker Initiating Note: Vidal Schwalbe, LCSWDate/ Time Initiated: 10/29/15/1309   Child's Name:   [name]  Legal Guardian: Mother   Need for Interpreter: None   Date of Referral: 10/29/15   Reason for Referral: Other (Comment) (hx of anxiety)   Referral Source: Physician   Address:    Phone number:     Household Members: Self, Minor Children, Spouse   Natural Supports (not living in the home): Cyrus, Medical laboratory scientific officer, Extended Family, Friends   Professional Supports:None   Employment:Unemployed   Type of Work:   NA  Education: Printmaker   Other Resources:   None  Cultural/Religious Considerations Which May Impact Care: None reported  Strengths: Ability to meet basic needs , Compliance with medical plan , Home prepared for child    Risk Factors/Current Problems: None   Cognitive State: Alert , Goal Oriented , Linear Thinking    Mood/Affect: Calm , Happy , Interested , Bright    CSW Assessment:LCSW received consult for hx of anxiety. Met with MOB and FOB in room for assessment. Discussed role and reason for consult. MOB reports her anxiety is from OCD, managed on Zoloft, no acute behaviors or symptoms reported. Reports no hx of depression and very bright and pleasant during assessment. MOB reports this is her 4th child (others: 9,10,13) and the only one spouse and self have planned to have. MOB expresses excitement over baby and siblings excited to help take care of baby. Home prepared for child, all necessary resources in room and in home. Plan will be to return to dwelling with husband and follow up with Pediatrician. No MH resources warranted or needed at this time. MOB aware of emotional state regarding  post-partum depression, anxiety, and reaching out for help. Large family all living very close to patient and able to assist with baby and needs. Per Rn, MOB to be discharged today after 4pm.  LCSW signing off, no community services requested or needed. Call/re-consult if needs arise.  CSW Plan/Description: No Further Intervention Required/No Barriers to Discharge    [place], LCSW 10/29/2015, 1:11 PM

## 2015-10-29 NOTE — Anesthesia Postprocedure Evaluation (Signed)
Anesthesia Post Note  Patient: Mora ApplCasey C [place]  Procedure(s) Performed: * No procedures listed *  Patient location during evaluation: Mother Baby Anesthesia Type: Epidural Level of consciousness: awake and alert Pain management: pain level controlled Vital Signs Assessment: post-procedure vital signs reviewed and stable Respiratory status: spontaneous breathing, nonlabored ventilation and respiratory function stable Cardiovascular status: stable Postop Assessment: no headache, no backache and epidural receding Anesthetic complications: no     Last Vitals:  Filed Vitals:   10/28/15 1932 10/29/15 0016  BP: 136/88 131/81  Pulse: 88 92  Temp: 36.9 C 36.7 C  Resp: 18 18    Last Pain:  Filed Vitals:   10/29/15 0554  PainSc: 4    Pain Goal: Patients Stated Pain Goal: 0 (10/28/15 1932)               Junious SilkGILBERT,Maricia Scotti

## 2015-10-30 ENCOUNTER — Other Ambulatory Visit: Payer: Self-pay | Admitting: Nurse Practitioner

## 2015-10-30 MED ORDER — HYDROCHLOROTHIAZIDE 25 MG PO TABS: 25.0000 mg | ORAL_TABLET | Freq: Every day | ORAL | Status: DC

## 2015-12-14 ENCOUNTER — Other Ambulatory Visit: Payer: Self-pay | Admitting: *Deleted

## 2015-12-14 ENCOUNTER — Telehealth: Payer: Self-pay | Admitting: *Deleted

## 2015-12-14 NOTE — Telephone Encounter (Signed)
[facility] for omeprazole 40mg  one day. 90 day supply and sertraline 100mg  take one and a half daily. 90 day supply. Pt last seen for med check 12/18/14. Also omeprazole not on med list.

## 2015-12-14 NOTE — Telephone Encounter (Signed)
Call pt, needs ov, if agrees to ov and schedule s we can do 90 d worth of both with no refill

## 2015-12-14 NOTE — Telephone Encounter (Signed)
TCNA 12/14/15

## 2015-12-15 ENCOUNTER — Other Ambulatory Visit: Payer: Self-pay | Admitting: *Deleted

## 2015-12-15 MED ORDER — OMEPRAZOLE 40 MG PO CPDR: DELAYED_RELEASE_CAPSULE | ORAL | Status: DC

## 2015-12-15 MED ORDER — SERTRALINE HCL 100 MG PO TABS: 150.0000 mg | ORAL_TABLET | Freq: Every day | ORAL | Status: DC

## 2015-12-15 NOTE — Telephone Encounter (Signed)
Fax came in from pharm again for meds. Pt has not returned call. 30 day supply sent to pharm with a note that pt needs to schedule office visit.

## 2015-12-22 ENCOUNTER — Other Ambulatory Visit: Payer: Self-pay | Admitting: *Deleted

## 2015-12-22 MED ORDER — HYDROCHLOROTHIAZIDE 25 MG PO TABS: 25.0000 mg | ORAL_TABLET | Freq: Every day | ORAL | Status: DC

## 2015-12-30 ENCOUNTER — Other Ambulatory Visit: Payer: Self-pay

## 2015-12-30 MED ORDER — OMEPRAZOLE 40 MG PO CPDR: DELAYED_RELEASE_CAPSULE | ORAL | 0 refills | Status: DC

## 2016-02-01 ENCOUNTER — Telehealth: Payer: Self-pay | Admitting: *Deleted

## 2016-02-01 NOTE — Telephone Encounter (Signed)
Patient wants to know if she can take viberzi while breastfeeding

## 2016-02-02 NOTE — Telephone Encounter (Signed)
Notified patient it is so new the resources are saying insufficient studies to say one way or other for sure, probably would not unless absolutely necessary. Patient wants to know is there anything else you can prescribe her in the place of this then. [facility] [place]

## 2016-02-02 NOTE — Telephone Encounter (Signed)
We've not seen pt for chronic ck up for one yr, rec ov first, not really sure what i'm treating

## 2016-02-02 NOTE — Telephone Encounter (Signed)
Notified patient we've not seen patient for chronic check up for one year, recommend office visit first, not really sure what he is treating. Patient verbalized understanding.

## 2016-02-02 NOTE — Telephone Encounter (Signed)
It is so new the resources are saying insufficient studies to say one way or other for sure, probalby woulfd not unless absolutely necessary

## 2016-02-09 ENCOUNTER — Telehealth: Payer: Self-pay | Admitting: Gastroenterology

## 2016-02-09 NOTE — Telephone Encounter (Signed)
Pt said she is not breast feeding so she is not taking the Viberzi. Just wants to know if it is OK. She said Dr. Gerda DissLuking is her pediatrician and he did not know since the medication is so new. Tobi Bastosnna, would you please advise!

## 2016-02-09 NOTE — Telephone Encounter (Signed)
[phone]  PLEASE CALL PATIENT, SHE HAS QUESTIONS ABOUT BREAST FEEDING WHILE ON VIBERZI

## 2016-02-10 NOTE — Telephone Encounter (Signed)
Correct: it should read, pt is NOW breast feeding. I have informed her of your recommendation Tobi Bastosnna. She would like to know of an alternative to the Viberzi. Please advise!

## 2016-02-10 NOTE — Telephone Encounter (Signed)
I need clarification: she IS or is NOT breastfeeding? Per clinical info, there is no human data available to assess risk of infant harm or effects on milk production. So, I would NOT use this while breastfeeding. Enough is not known. I would use an alternative option if she becomes pregnant and/or breastfeeding.

## 2016-02-15 NOTE — Telephone Encounter (Signed)
Pt is aware and will reach out to the [facility].

## 2016-02-15 NOTE — Telephone Encounter (Signed)
Unfortunately, options are limited. There is a possible risk of infant apnea based on limited data while taking Bentyl and breastfeeding. These medications are not completely studied with breastfeeding.   I would call her OB and ask if she feels comfortable with her taking Imodium as needed and/or a probiotic. Levsin has no human data available that I am aware, and caution is advised while breastfeeding. Her OB providers will be the [employer] [facility] resource with these medications. Who does she see? I would take nothing right now except follow dietary measures and talk to her OB about an acceptable list (ask about Levsin and Imodium specifically). I can call if she gives me the provider info.   Just as an FYI:  I have never seen her in the office. I am not familiar with her

## 2016-03-12 ENCOUNTER — Other Ambulatory Visit: Payer: Self-pay | Admitting: Family Medicine

## 2016-03-23 ENCOUNTER — Ambulatory Visit: Payer: 59

## 2016-04-04 ENCOUNTER — Ambulatory Visit: Payer: Self-pay

## 2016-04-09 ENCOUNTER — Other Ambulatory Visit: Payer: Self-pay | Admitting: Gastroenterology

## 2016-04-13 ENCOUNTER — Other Ambulatory Visit: Payer: Self-pay

## 2016-04-27 ENCOUNTER — Telehealth: Payer: Self-pay | Admitting: Gastroenterology

## 2016-04-27 NOTE — Telephone Encounter (Signed)
Pt was given Viberzi 100 mg bid in 09/2014 ( see procedure note).  Routing to refill box.

## 2016-04-27 NOTE — Telephone Encounter (Signed)
Pt said she had to stop breast feeding about 2 months ago. Her problems with her stomach were coming back and she needed the Viberzi. She talked to her OB/GYN and was told if she needed the Viberzi to just stop breast feeding and she did. She has some Viberzi on hand and completed them and has been out for a couple of weeks.

## 2016-04-27 NOTE — Telephone Encounter (Signed)
Pt was returning a call from DS> Please call [phone]

## 2016-04-27 NOTE — Telephone Encounter (Signed)
Pt called to make an OV to further her refills of Viberzi and is aware of appt date and time. She asked if we could send in to her pharmacy just enough to hold her until her OV on 12/6. [facility] [place]

## 2016-04-27 NOTE — Telephone Encounter (Signed)
See previous note

## 2016-04-27 NOTE — Telephone Encounter (Signed)
Please call the patient. Last phone note a couple months ago indicated she IS breastfeeding. Please let me know if this is still the case.

## 2016-04-27 NOTE — Telephone Encounter (Signed)
Tried to call. VM not set up.

## 2016-05-02 MED ORDER — ELUXADOLINE 100 MG PO TABS: 1.0000 | ORAL_TABLET | Freq: Two times a day (BID) | ORAL | 3 refills | Status: DC

## 2016-05-02 NOTE — Telephone Encounter (Signed)
I completed prescription. It will not be e-prescribed, so it needs to be faxed.   Gelene MinkAnna W. Jaquil Todt, ANP-BC [facility]

## 2016-05-02 NOTE — Telephone Encounter (Signed)
Pt is aware that [facility]

## 2016-05-02 NOTE — Addendum Note (Signed)
Addended by: Gelene MinkBOONE, Jadis Mika W on: 05/02/2016 03:49 PM   Modules accepted: Orders

## 2016-05-11 ENCOUNTER — Ambulatory Visit (INDEPENDENT_AMBULATORY_CARE_PROVIDER_SITE_OTHER): Payer: 59 | Admitting: Gastroenterology

## 2016-05-11 ENCOUNTER — Encounter: Payer: Self-pay | Admitting: Gastroenterology

## 2016-05-11 DIAGNOSIS — K580 Irritable bowel syndrome with diarrhea: Secondary | ICD-10-CM

## 2016-05-11 DIAGNOSIS — K219 Gastro-esophageal reflux disease without esophagitis: Secondary | ICD-10-CM | POA: Diagnosis not present

## 2016-05-11 NOTE — Patient Instructions (Addendum)
DRINK WATER TO KEEP YOUR URINE LIGHT YELLOW.  FOLLOW A HIGH FIBER DIET. AVOID ITEMS THAT CAUSE BLOATING & GAS. SEE INFO BELOW.  CONTINUE VIBERZI. TAKE ONE TABLET TWICE DAILY. ONLY USE IMODIUM 1 OR 2 TIMES A DAY IF NEEDED.  PLEASE CALL IF YOU GO MORE THAN 4 DAYS WITHOUT A BOWEL MOVEMENT OR YOU DEVELOP INCREASE IN NAUSEA, VOMITING, OR ABDOMINAL PAIN.  FOLLOW UP IN 6 MOS.   High-Fiber Diet A high-fiber diet changes your normal diet to include more whole grains, legumes, fruits, and vegetables. Changes in the diet involve replacing refined carbohydrates with unrefined foods. The calorie level of the diet is essentially unchanged. The Dietary Reference Intake (recommended amount) for adult males is 38 grams per day. For adult females, it is 25 grams per day. Pregnant and lactating women should consume 28 grams of fiber per day. Fiber is the intact part of a plant that is not broken down during digestion. Functional fiber is fiber that has been isolated from the plant to provide a beneficial effect in the body. PURPOSE  Increase stool bulk.   Ease and regulate bowel movements.   Lower cholesterol.   REDUCE RISK OF COLON CANCER  INDICATIONS THAT YOU NEED MORE FIBER  Constipation and hemorrhoids.   Uncomplicated diverticulosis (intestine condition) and irritable bowel syndrome.   Weight management.   As a protective measure against hardening of the arteries (atherosclerosis), diabetes, and cancer.   GUIDELINES FOR INCREASING FIBER IN THE DIET  Start adding fiber to the diet slowly. A gradual increase of about 5 more grams (2 slices of whole-wheat bread, 2 servings of most fruits or vegetables, or 1 bowl of high-fiber cereal) per day is best. Too rapid an increase in fiber may result in constipation, flatulence, and bloating.   Drink enough water and fluids to keep your urine clear or pale yellow. Water, juice, or caffeine-free drinks are recommended. Not drinking enough fluid may cause  constipation.   Eat a variety of high-fiber foods rather than one type of fiber.   Try to increase your intake of fiber through using high-fiber foods rather than fiber pills or supplements that contain small amounts of fiber.   The goal is to change the types of food eaten. Do not supplement your present diet with high-fiber foods, but replace foods in your present diet.   INCLUDE A VARIETY OF FIBER SOURCES  Replace refined and processed grains with whole grains, canned fruits with fresh fruits, and incorporate other fiber sources. White rice, white breads, and most bakery goods contain little or no fiber.   Brown whole-grain rice, buckwheat oats, and many fruits and vegetables are all good sources of fiber. These include: broccoli, Brussels sprouts, cabbage, cauliflower, beets, sweet potatoes, white potatoes (skin on), carrots, tomatoes, eggplant, squash, berries, fresh fruits, and dried fruits.   Cereals appear to be the richest source of fiber. Cereal fiber is found in whole grains and bran. Bran is the fiber-rich outer coat of cereal grain, which is largely removed in refining. In whole-grain cereals, the bran remains. In breakfast cereals, the largest amount of fiber is found in those with "bran" in their names. The fiber content is sometimes indicated on the label.   You may need to include additional fruits and vegetables each day.   In baking, for 1 cup white flour, you may use the following substitutions:   1 cup whole-wheat flour minus 2 tablespoons.   1/2 cup white flour plus 1/2 cup whole-wheat flour.

## 2016-05-11 NOTE — Progress Notes (Signed)
   Subjective:    Patient ID: [name], female    DOB: 05/13/1976, 40 y.o.   MRN: [id number]  Marie SouthSteve Luking, MD  HPI Bowels are good. VIBERZI WORKS WELL. BMs: #4 DAILY. WITHIN 30 MINS GETS A WAVE OF NAUSEA AND THEN IT GOES AWAY. RARE WINE.   PT DENIES FEVER, CHILLS, HEMATOCHEZIA, HEMATEMESIS, vomiting, melena, diarrhea, CHEST PAIN, SHORTNESS OF BREATH, CHANGE IN BOWEL IN HABITS, constipation, abdominal pain, problems swallowing, OR  heartburn or indigestion.  Past Medical History:  Diagnosis Date  . Depression   . Gestational diabetes   . Headaches due to old head injury   . Insomnia   . Nausea     Past Surgical History:  Procedure Laterality Date  . COLONOSCOPY N/A 09/12/2014   SLF: 1. No obvious source for diarrhea/ abdominal pain identified. IIt's most likely due to IBS-D. 2. Mild diverticulosis in the right colon 3. Moderate sized external hemorrhoids.   . none to date     08/18/14    Allergies  Allergen Reactions  . Amoxicillin-Pot Clavulanate Other (See Comments)    Makes colon bleed  . Cefuroxime Axetil Other (See Comments)    GI upset makes colon bleed  . Cephalosporins     GI upset  . Levaquin [Levofloxacin In D5w] Other (See Comments)    Colitis, GI upset, causes colon to bleed  . Temazepam Diarrhea  . Latex Rash and Other (See Comments)    Creates water blisters    Current Outpatient Prescriptions  Medication Sig Dispense Refill  . ACETAMINOPHEN-BUTALBITAL 50-325 MG TABS TAKE 1-2 TABLETS EVERY 4-6 HOURS AS DIRECTED FOR HEADACHE    . Eluxadoline (VIBERZI) 100 MG TABS Take 1 tablet by mouth 2 (two) times daily with a meal.    . omeprazole (PRILOSEC) 40 MG capsule TAKE 1 CAPSULE (40 MG TOTAL) BY MOUTH DAILY AS NEEDED FOR ACID REFLUX    . sertraline 100 MG tablet Take 1.5 tablets (150 mg total) by mouth daily.     .      . [facility] PRENATAL GUMMY 0.4-113.5 MG CHEW Chew 1 tablet by mouth daily.     Review of Systems PER HPI OTHERWISE ALL SYSTEMS ARE NEGATIVE.   Objective:   Physical Exam  Constitutional: She is oriented to person, place, and time. She appears well-developed and well-nourished. No distress.  HENT:  Head: Normocephalic and atraumatic.  Mouth/Throat: Oropharynx is clear and moist. No oropharyngeal exudate.  Eyes: Pupils are equal, round, and reactive to light. No scleral icterus.  Neck: Normal range of motion. Neck supple.  Cardiovascular: Normal rate, regular rhythm and normal heart sounds.   Pulmonary/Chest: Effort normal and breath sounds normal. No respiratory distress.  Abdominal: Soft. Bowel sounds are normal. She exhibits no distension. There is no tenderness.  Musculoskeletal: She exhibits no edema.  Lymphadenopathy:    She has no cervical adenopathy.  Neurological: She is alert and oriented to person, place, and time.  NO FOCAL DEFICITS  Psychiatric: She has a normal mood and affect.  Vitals reviewed.     Assessment & Plan:

## 2016-05-11 NOTE — Assessment & Plan Note (Signed)
SYMPTOMS CONTROLLED/RESOLVED ON VIBERZI.  DRINK WATER TO KEEP YOUR URINE LIGHT YELLOW. FOLLOW A HIGH FIBER DIET. AVOID ITEMS THAT CAUSE BLOATING & GAS.  CONTINUE VIBERZI. TAKE ONE TABLET TWICE DAILY. ONLY USE IMODIUM 1 OR 2 TIMES A DAY IF NEEDED. PLEASE CALL IF YOU GO MORE THAN 4 DAYS WITHOUT A BOWEL MOVEMENT OR YOU DEVELOP INCREASE IN NAUSEA, VOMITING, OR ABDOMINAL PAIN. FOLLOW UP IN 6 MOS.   GREATER THAN 50% WAS SPENT IN COUNSELING & COORDINATION OF CARE WITH THE PATIENT: DISCUSSED MEDICATION SIDE EFFECTS,  BENEFITS, RISKS OF PANCREATITIS, AND MANAGEMENT OF IBS. TOTAL ENCOUNTER TIME: 15 MINS.

## 2016-05-11 NOTE — Assessment & Plan Note (Signed)
SYMPTOMS FAIRLY WELL CONTROLLED.  CONTINUE TO MONITOR SYMPTOMS. 

## 2016-07-08 ENCOUNTER — Other Ambulatory Visit: Payer: Self-pay | Admitting: Family Medicine

## 2016-07-21 ENCOUNTER — Other Ambulatory Visit: Payer: Self-pay | Admitting: Family Medicine

## 2016-07-21 NOTE — Telephone Encounter (Signed)
Call pt, not seen since 2016, 30 d worth , o v mandatory for further

## 2016-08-09 ENCOUNTER — Other Ambulatory Visit: Payer: Self-pay | Admitting: Family Medicine

## 2016-10-13 ENCOUNTER — Other Ambulatory Visit: Payer: Self-pay | Admitting: Family Medicine

## 2016-10-14 ENCOUNTER — Other Ambulatory Visit: Payer: Self-pay | Admitting: Family Medicine

## 2017-03-06 ENCOUNTER — Other Ambulatory Visit: Payer: Self-pay

## 2017-03-06 ENCOUNTER — Ambulatory Visit (HOSPITAL_COMMUNITY)
Admission: RE | Admit: 2017-03-06 | Discharge: 2017-03-06 | Disposition: A | Discharge status: Home / Self Care | Payer: BLUE CROSS/BLUE SHIELD | Source: Ambulatory Visit | Attending: Gastroenterology | Admitting: Gastroenterology

## 2017-03-06 ENCOUNTER — Other Ambulatory Visit (HOSPITAL_COMMUNITY)
Admission: RE | Admit: 2017-03-06 | Discharge: 2017-03-06 | Disposition: A | Discharge status: Home / Self Care | Payer: BLUE CROSS/BLUE SHIELD | Source: Ambulatory Visit | Attending: Gastroenterology | Admitting: Gastroenterology

## 2017-03-06 ENCOUNTER — Telehealth: Payer: Self-pay

## 2017-03-06 ENCOUNTER — Telehealth: Payer: Self-pay | Admitting: Gastroenterology

## 2017-03-06 DIAGNOSIS — R1032 Left lower quadrant pain: Secondary | ICD-10-CM

## 2017-03-06 DIAGNOSIS — R197 Diarrhea, unspecified: Secondary | ICD-10-CM

## 2017-03-06 DIAGNOSIS — N83201 Unspecified ovarian cyst, right side: Secondary | ICD-10-CM | POA: Diagnosis not present

## 2017-03-06 LAB — COMPREHENSIVE METABOLIC PANEL
ALT: 13 U/L — ABNORMAL LOW (ref 14–54)
AST: 15 U/L (ref 15–41)
Albumin: 4.4 g/dL (ref 3.5–5.0)
Alkaline Phosphatase: 70 U/L (ref 38–126)
Anion gap: 9 (ref 5–15)
BUN: 10 mg/dL (ref 6–20)
CHLORIDE: 106 mmol/L (ref 101–111)
CO2: 22 mmol/L (ref 22–32)
Calcium: 9.2 mg/dL (ref 8.9–10.3)
Creatinine, Ser: 0.66 mg/dL (ref 0.44–1.00)
Glucose, Bld: 93 mg/dL (ref 65–99)
POTASSIUM: 4.4 mmol/L (ref 3.5–5.1)
Sodium: 137 mmol/L (ref 135–145)
Total Bilirubin: 0.8 mg/dL (ref 0.3–1.2)
Total Protein: 7.6 g/dL (ref 6.5–8.1)

## 2017-03-06 LAB — CBC WITH DIFFERENTIAL/PLATELET
BASOS ABS: 0.1 10*3/uL (ref 0.0–0.1)
Basophils Relative: 1 %
EOS PCT: 2 %
Eosinophils Absolute: 0.2 10*3/uL (ref 0.0–0.7)
HCT: 43.7 % (ref 36.0–46.0)
Hemoglobin: 14.8 g/dL (ref 12.0–15.0)
LYMPHS PCT: 23 %
Lymphs Abs: 2.2 10*3/uL (ref 0.7–4.0)
MCH: 29.4 pg (ref 26.0–34.0)
MCHC: 33.9 g/dL (ref 30.0–36.0)
MCV: 86.7 fL (ref 78.0–100.0)
MONO ABS: 0.7 10*3/uL (ref 0.1–1.0)
Monocytes Relative: 7 %
Neutro Abs: 6.6 10*3/uL (ref 1.7–7.7)
Neutrophils Relative %: 67 %
PLATELETS: 236 10*3/uL (ref 150–400)
RBC: 5.04 MIL/uL (ref 3.87–5.11)
RDW: 13.7 % (ref 11.5–15.5)
WBC: 9.7 10*3/uL (ref 4.0–10.5)

## 2017-03-06 MED ORDER — IOPAMIDOL (ISOVUE-300) INJECTION 61%
INTRAVENOUS | Status: AC
  Administered 2017-03-06: 30 mL
  Filled 2017-03-06: qty 30

## 2017-03-06 MED ORDER — IOPAMIDOL (ISOVUE-300) INJECTION 61%
100.0000 mL | Freq: Once | INTRAVENOUS | Status: AC | PRN
  Administered 2017-03-06: 100 mL via INTRAVENOUS

## 2017-03-06 NOTE — Telephone Encounter (Signed)
Jennifer from [facility] CT called with a stat report on this pt. Her report is in Epic.

## 2017-03-06 NOTE — Telephone Encounter (Signed)
I spoke to pt and she is aware. She has only had a sip of Gatorade with a phenergan tablet. She has changed insurance and now has BCBS. Call transferred to [facility].

## 2017-03-06 NOTE — Telephone Encounter (Signed)
I called again, asked pt to call right away so we can get this scheduled. We need to know if she has had anything to eat or drink this morning before we schedule. Lab orders have been faxed to [facility] lab.

## 2017-03-06 NOTE — Telephone Encounter (Signed)
PLEASE CALL PT. Her last colonoscopy showed diverticulosis in her right colon not her left. Her last CT OF ABD SHOWED COLITIS IN THE TRANSVERSE COLON. SHE NEEDS A CT SCAN ABD/PELVIS TODAY WITH IV AND ORAL CONTRAST TO EVALUATE HER ABDOMINAL PAIN. SHE NEEDS A CBC AND BMP TODAY PRIOR TO CT SCAN.Marland Kitchen

## 2017-03-06 NOTE — Telephone Encounter (Signed)
[phone]  PLEASE CALL PATIENT, SHE IS HAVING A DIVERTICULITIS FLAIR AND MAY NEED SOMETHING SENT TO HER PHARMACY, NOT COMING HERE UNTIL 10/17

## 2017-03-06 NOTE — Telephone Encounter (Signed)
Attempted to start PA for CT abd/pelvis with contrast via [facility] website. According to website, pt isn't currently active.

## 2017-03-06 NOTE — Telephone Encounter (Signed)
PT is aware and will go to lab ( [facility]) and pick up containers for the stool tests.

## 2017-03-06 NOTE — Telephone Encounter (Signed)
PLEASE CALL PT. She does not have diverticulitis. SHE MAY HAVE AN IBS FLARE, FOOD BORNE ILLNESS, OR VIRAL GASTROENTERITIS. SHE SHOULD SUBMIT STOOL FOR C DIFF PCR AND GI PANEL. USE KAOPECTATE IF NEEDED FOR DIARRHEA AND TYLENOL OR IBUPROFEN 600 MG TID FOR PAIN. DRINK WATER TO KEEP YOUR URINE LIGHT YELLOW. FOLLOW A FULL LIQUID DIET FOR 3 DAYS.

## 2017-03-06 NOTE — Telephone Encounter (Signed)
LMOM to call that Dr. Darrick Penna wants her to have a CT and labs. Forwarding to [place] to schedule the CT.

## 2017-03-06 NOTE — Telephone Encounter (Signed)
Pt has [employer]. Received info from insurance card. Called [facility] [place]. No PA needed for CT.

## 2017-03-06 NOTE — Telephone Encounter (Signed)
Called [facility] Sched[facility], pt can go to [facility] now to start drinking contrast. Called pt and informed her. She is aware to remain NPO and to have labs drawn at hospital.

## 2017-03-06 NOTE — Telephone Encounter (Signed)
Routing to Doris 

## 2017-03-06 NOTE — Telephone Encounter (Signed)
I called pt and she has been having problems since last Tuesday. She said that usually they will subside after a couple of days, but not this time. She is having pain and cramping in her LLQ. The pain is constant but sometimes gets very bad. She is having explosive loose stringing stools a  Couple of times a day. A lot of nausea and dry heaves. She is drinking Gatorade.  Her next appt is 03/22/2017 with Dr. Darrick Penna. Please advise!

## 2017-03-08 ENCOUNTER — Telehealth: Payer: Self-pay

## 2017-03-08 NOTE — Telephone Encounter (Signed)
Pt called to say that she took the stool studies to the lab and they wanted to do blood work also. She told them she had it done at the hospital. I told her the results are in and I will let Dr. Darrick Penna know that she has called.

## 2017-03-09 ENCOUNTER — Telehealth: Payer: Self-pay

## 2017-03-09 LAB — CLOSTRIDIUM DIFFICILE BY PCR: CDIFFPCR: NOT DETECTED

## 2017-03-09 NOTE — Telephone Encounter (Addendum)
PLEASE CALL PT. HER RESULTS WERE RELEASED IN MYCHART. HER BLOOD COUNT, KIDNEY FUNCTION, AND LIVER ENZYMES ARE NORMAL.

## 2017-03-09 NOTE — Telephone Encounter (Signed)
Pt left Vm that she will be awaiting the results of her stool test. Also, does she need to keep appt on 03/22/2017 with Dr. Darrick Penna.

## 2017-03-09 NOTE — Telephone Encounter (Signed)
LMOM- labs normal 

## 2017-03-13 LAB — GASTROINTESTINAL PATHOGEN PANEL PCR
C. DIFFICILE TOX A/B, PCR: NOT DETECTED
Campylobacter, PCR: NOT DETECTED
Cryptosporidium, PCR: NOT DETECTED
E COLI (STEC) STX1/STX2, PCR: NOT DETECTED
E COLI 0157, PCR: NOT DETECTED
E coli (ETEC) LT/ST PCR: NOT DETECTED
Giardia lamblia, PCR: NOT DETECTED
Norovirus, PCR: NOT DETECTED
Rotavirus A, PCR: NOT DETECTED
SALMONELLA, PCR: NOT DETECTED
Shigella, PCR: NOT DETECTED

## 2017-03-14 NOTE — Telephone Encounter (Signed)
LMOM to call.

## 2017-03-14 NOTE — Telephone Encounter (Signed)
PLEASE CALL PT. Her STOOL STUDIES ARE NEGATIVE. SHE LIKELY HAD AN IBS FLARE. SHE SHOULD TAKE HER VIBERZI TWICE DAILY. IF NEEDED SHE CAN USE ONLY IMODIUM TO CONTROL HER STOOLS. AVOID DAIRY INCLUDING YOGURT WITH A PROBIOTIC. SHE SHOULD KEEP HER APPT NEXT WEEK.

## 2017-03-14 NOTE — Telephone Encounter (Signed)
Pt is aware.  

## 2017-03-22 ENCOUNTER — Ambulatory Visit (INDEPENDENT_AMBULATORY_CARE_PROVIDER_SITE_OTHER): Payer: BLUE CROSS/BLUE SHIELD | Admitting: Gastroenterology

## 2017-03-22 ENCOUNTER — Other Ambulatory Visit: Payer: Self-pay

## 2017-03-22 ENCOUNTER — Encounter: Payer: Self-pay | Admitting: Gastroenterology

## 2017-03-22 ENCOUNTER — Other Ambulatory Visit: Payer: Self-pay | Admitting: Gastroenterology

## 2017-03-22 DIAGNOSIS — K219 Gastro-esophageal reflux disease without esophagitis: Secondary | ICD-10-CM | POA: Diagnosis not present

## 2017-03-22 DIAGNOSIS — K580 Irritable bowel syndrome with diarrhea: Secondary | ICD-10-CM | POA: Diagnosis not present

## 2017-03-22 MED ORDER — PROMETHAZINE HCL 12.5 MG PO TABS: ORAL_TABLET | ORAL | 1 refills | Status: DC

## 2017-03-22 NOTE — Assessment & Plan Note (Signed)
SYMPTOMS CONTROLLED/RESOLVED.  CONTINUE TO MONITOR SYMPTOMS. CONTINUE OMEPRAZOLE.  TAKE 30 MINUTES PRIOR TO YOUR FIRST MEAL. FOLLOW UP IN 3 MOS.

## 2017-03-22 NOTE — Patient Instructions (Addendum)
DRINK WATER TO KEEP YOUR URINE LIGHT YELLOW.  FOLLOW A HIGH FIBER DIET. AVOID ITEMS THAT CAUSE BLOATING & GAS.   YOU ARE TAKING VIBERZI 100 MG TABETS. CONTINUE VIBERZI TWICE DAILY. REDUCE ONCE YOUR FLARE IS UNDER CONTROL.  USE PHENERGAN 1/2 -1 TABLETS EVERY 6 HOURS WHEN NEEDED TO CONTROL NAUSEA.  FOLLOW UP IN 3 MOS.

## 2017-03-22 NOTE — Addendum Note (Signed)
Addended by: West BaliFIELDS, Sarie Stall L on: 03/22/2017 03:37 PM   Modules accepted: Orders

## 2017-03-22 NOTE — Assessment & Plan Note (Addendum)
WITH RECENT FLARE. CT SHOWS NO DIVERTICULITIS. SYMPTOMS NOT IDEALLY CONTROLLED BUT CLINICALLY IMPROVED. VIBERZI MAKES HER FEEL SPACED OUT BUT NO SYMPTOMS OF PANCREATITIS. NAUSEA NOT CONTROLLED.   CONTINUE TO MONITOR SYMPTOMS. SPOKE WITH PHARMACY AND CONFIRMED SHE IS TAKING VIBERZI 100 MG TABLETS. LAST FILLED #180 TABLETS NOV 2017. CONTINUE VIBERZI TWICE DAILY. REDUCE ONCE YOUR FLARE IS UNDER CONTROL. USE PHENERGAN 1/2 -1 TABLETS EVERY 6 HOURS WHEN NEEDED TO CONTROL NAUSEA. FOLLOW UP IN 3 MOS.

## 2017-03-22 NOTE — Progress Notes (Signed)
   Subjective:    Patient ID: [name], female    DOB: 05/09/1976, 41 y.o.   MRN: [id number]  HPI IS A LITTLE BETTER. VOMITING UNDER CONTROL. CAN HANDLE CONSISTENCY OF BOWEL MOVEMENTS. WHEN SHE WAKES UP EVERY AM SHE IS NAUSEATED. BMS: #4-5. ON VIBERZI BID FOR PAST. HATES THE AM DOSING BECAUSE IT MAKES HER FEEL WEIRD. NIGHTTIME NOT SO MUCH.  THINKS SHE TAKING 75 MG BID. PAIN BETTER OVER PAST WEEK. IF DIES HAVE PAIN IT'S BOTTOM LEFT QUADRANT(ACHY AND HEAVINESS). ON ZOLOFT FOR A LONG TIME. VERY OCD WHEN ZOLOFT REDUCED SHE BREAKS OUT IN HIVES. 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   PT DENIES FEVER, CHILLS, HEMATOCHEZIA, vomiting, melena, diarrhea, CHEST PAIN, SHORTNESS OF BREATH, CHANGE IN BOWEL IN HABITS, constipation, problems swallowing, OR heartburn or indigestion.   Past Medical History:  Diagnosis Date  . Depression   . Gestational diabetes   . Headaches due to old head injury   . Insomnia   . Nausea     Past Surgical History:  Procedure Laterality Date  . COLONOSCOPY N/A 09/12/2014   SLF: 1. No obvious source for diarrhea/ abdominal pain identified. IIt's most likely due to IBS-D. 2. Mild diverticulosis in the right colon 3. Moderate sized external hemorrhoids.   . none to date     08/18/14    Allergies  Allergen Reactions  . Amoxicillin-Pot Clavulanate Other (See Comments)    Makes colon bleed  . Cefuroxime Axetil Other (See Comments)    GI upset makes colon bleed  . Cephalosporins     GI upset  . Levaquin [Levofloxacin In D5w] Other (See Comments)    Colitis, GI upset, causes colon to bleed  . Temazepam Diarrhea  . Latex Rash and Other (See Comments)    Creates water blisters   Current Outpatient Prescriptions  Medication Sig Dispense Refill  . ACETAMINOPHEN-BUTALBITAL 50-325 MG TABS TAKE 1-2 TABLETS EVERY 4-6 HOURS AS DIRECTED FOR HEADACHE 60 each 5  . cetirizine (ZYRTEC) 10 MG tablet Take 10 mg by mouth daily. Alternates with claritin      . Eluxadoline (VIBERZI) 100 MG TABS Take 1 tablet by mouth 2 (two) times daily with a meal.    . omeprazole (PRILOSEC) 40 MG capsule TAKE 1 CAPSULE (40 MG TOTAL) BY MOUTH DAILY AS NEEDED FOR ACID REFLUX    . sertraline (ZOLOFT) 100 MG tablet TAKE 1 & 1/2 TABLETS BY MOUTH DAILY.    Marland Kitchen. topiramate (TOPAMAX) 25 MG tablet Take 1 tablet by mouth daily.        Review of Systems PER HPI OTHERWISE ALL SYSTEMS ARE NEGATIVE.    Objective:   Physical Exam        Assessment & Plan:

## 2017-03-23 NOTE — Progress Notes (Signed)
cc'ed to pcp °

## 2017-03-23 NOTE — Progress Notes (Signed)
ON RECALL  °

## 2017-03-27 ENCOUNTER — Other Ambulatory Visit: Payer: Self-pay

## 2017-03-27 NOTE — Telephone Encounter (Signed)
Pt was seen Friday 03/24/17 and I faxed pts refill of Viberzi 100mg  one tab po bid with meals, decrease when symptoms are improving per ov note on 03/24/17

## 2017-03-28 ENCOUNTER — Other Ambulatory Visit: Payer: Self-pay | Admitting: Family Medicine

## 2017-05-11 ENCOUNTER — Encounter: Payer: Self-pay | Admitting: Gastroenterology

## 2017-06-21 ENCOUNTER — Other Ambulatory Visit: Payer: Self-pay | Admitting: *Deleted

## 2017-06-22 MED ORDER — OMEPRAZOLE 40 MG PO CPDR: DELAYED_RELEASE_CAPSULE | ORAL | 0 refills | Status: DC

## 2017-06-22 NOTE — Telephone Encounter (Signed)
Apparently ordered just two mo ago by carolyn so one more 90 d on it andf write needs o v

## 2017-08-07 ENCOUNTER — Ambulatory Visit (INDEPENDENT_AMBULATORY_CARE_PROVIDER_SITE_OTHER): Payer: BLUE CROSS/BLUE SHIELD | Admitting: Nurse Practitioner

## 2017-08-07 ENCOUNTER — Encounter: Payer: Self-pay | Admitting: Nurse Practitioner

## 2017-08-07 VITALS — BP 122/80 | Ht 65.5 in | Wt 180.0 lb

## 2017-08-07 DIAGNOSIS — N951 Menopausal and female climacteric states: Secondary | ICD-10-CM | POA: Diagnosis not present

## 2017-08-07 DIAGNOSIS — F3281 Premenstrual dysphoric disorder: Secondary | ICD-10-CM

## 2017-08-07 DIAGNOSIS — T3995XA Adverse effect of unspecified nonopioid analgesic, antipyretic and antirheumatic, initial encounter: Secondary | ICD-10-CM

## 2017-08-07 DIAGNOSIS — G4440 Drug-induced headache, not elsewhere classified, not intractable: Secondary | ICD-10-CM | POA: Diagnosis not present

## 2017-08-07 DIAGNOSIS — G43009 Migraine without aura, not intractable, without status migrainosus: Secondary | ICD-10-CM

## 2017-08-07 DIAGNOSIS — F418 Other specified anxiety disorders: Secondary | ICD-10-CM | POA: Diagnosis not present

## 2017-08-07 MED ORDER — BUTALBITAL-ACETAMINOPHEN 50-325 MG PO TABS: ORAL_TABLET | ORAL | 5 refills | Status: DC

## 2017-08-07 MED ORDER — SERTRALINE HCL 100 MG PO TABS: 150.0000 mg | ORAL_TABLET | Freq: Every day | ORAL | 5 refills | Status: DC

## 2017-08-07 NOTE — Patient Instructions (Signed)
Premenstrual Syndrome Premenstrual syndrome (PMS) is a group of physical, emotional, and behavioral symptoms that affect women of childbearing age. PMS starts 1-2 weeks before the start of a woman's period and goes away a few days after the period starts. It often recurs in a predictable pattern. PMS can range from mild to severe. When it is severe, it is called premenstrual dysphoric disorder (PMDD). PMS can interfere in many ways with normal daily activities. What are the causes? The cause of this condition is not known, but it seems to be related to hormone changes that happen before menstruation. What are the signs or symptoms? Symptoms of this condition often happen every month. They go away completely after your period starts. Physical symptoms include:  Bloating.  Breast pain.  Headaches.  Extreme fatigue.  Backaches.  Swelling of the hands and feet.  Weight gain.  Hot flashes.  Emotional and behavioral symptoms include:  Mood swings.  Depression.  Angry outbursts.  Irritability.  Anxiety.  Crying spells.  Food cravings or appetite changes.  Changes in sexual desire.  Confusion.  Aggression.  Social withdrawal.  Poor concentration.  How is this diagnosed? This condition is diagnosed if symptoms of PMS:  Are present in the 5 days before your period starts.  End within 4 days after your period starts.  Happen at least 3 months in a row.  Interfere with some of your normal activities.  Other conditions that can cause some of these symptoms must be ruled out before PMS can be diagnosed. How is this treated? This condition may be treated by:  Maintaining a healthy lifestyle. This includes eating a balanced diet and exercising regularly.  Taking medicines. Medicines can help relieve symptoms such as cramps, aches, pains, headaches, and breast tenderness. Depending on the severity of the condition, your health care provider may  recommend: ? Over-the-counter pain medicines. ? Prescription medicines for PMDD.  Follow these instructions at home: Eating and drinking   Eat a well-balanced diet.  Avoid caffeine and alcohol.  Limit the amount of salt and salty foods you eat. This will help lessen bloating.  Drink enough fluid to keep your urine clear or pale yellow.  Take a multivitamin if told to by your health care provider. Lifestyle  Do not use any tobacco products, such as cigarettes, chewing tobacco, and e-cigarettes. If you need help quitting, ask your health care provider.  Exercise regularly as suggested by your health care provider.  Get enough sleep.  Practice relaxation techniques.  Limit stress. Other Instructions  For 2-3 months, write down your symptoms, their severity, and how long they last. This will help your health care provider choose the best treatment for you.  Take over-the-counter and prescription medicines only as told by your health care provider.  If you are using oral contraceptive pills, use them as told by your health care provider. This information is not intended to replace advice given to you by your health care provider. Make sure you discuss any questions you have with your health care provider. Document Released: 05/20/2000 Document Revised: 06/24/2015 Document Reviewed: 02/20/2015 Elsevier Interactive Patient Education  2018 Elsevier Inc.  

## 2017-08-08 ENCOUNTER — Encounter: Payer: Self-pay | Admitting: Nurse Practitioner

## 2017-08-08 DIAGNOSIS — N951 Menopausal and female climacteric states: Secondary | ICD-10-CM | POA: Insufficient documentation

## 2017-08-08 DIAGNOSIS — F3281 Premenstrual dysphoric disorder: Secondary | ICD-10-CM | POA: Insufficient documentation

## 2017-08-08 DIAGNOSIS — G4440 Drug-induced headache, not elsewhere classified, not intractable: Secondary | ICD-10-CM | POA: Insufficient documentation

## 2017-08-08 DIAGNOSIS — G43009 Migraine without aura, not intractable, without status migrainosus: Secondary | ICD-10-CM | POA: Insufficient documentation

## 2017-08-08 DIAGNOSIS — T3995XA Adverse effect of unspecified nonopioid analgesic, antipyretic and antirheumatic, initial encounter: Secondary | ICD-10-CM

## 2017-08-08 NOTE — Progress Notes (Signed)
Subjective:  Presents for recheck on depression and anxiety. Taking Zoloft 150 mg. Has noticed increased anxiety lately. Uses condoms consistently for birth control. For the past 6 months, has had an average of 2 cycles per month, lasting 3-4 days with normal flow. Having intense agitation, irritability and crying spells about 3-5 days before each cycle, goes away during cycle and then recurs for a couple of days after. Also having multiple migraines per month. Has been overusing ACE/Butalbital. Her gynecology prescribed Topamax 25 mg but patient has stopped since headaches did not improve. Denies any side effects. This is the second time she has taken Topamax but again dose was only 25 mg. Denies suicidal or homicidal thoughts or ideation. [facility] 08/07/2017 09/23/2015 08/26/2015 07/15/2015  Decreased Interest 2 0 0 0  Down, Depressed, Hopeless 1 0 0 0  PHQ - 2 Score 3 0 0 0  Altered sleeping 2 - - -  Tired, decreased energy 2 - - -  Change in appetite 2 - - -  Feeling bad or failure about yourself  0 - - -  Trouble concentrating 1 - - -  Moving slowly or fidgety/restless 0 - - -  Suicidal thoughts 0 - - -  PHQ-9 Score 10 - - -   GAD 7 : Generalized Anxiety Score 08/07/2017  Nervous, Anxious, on Edge 2  Control/stop worrying 3  Worry too much - different things 3  Trouble relaxing 2  Restless 2  Easily annoyed or irritable 3  Afraid - awful might happen 2  Total GAD 7 Score 17  Anxiety Difficulty Somewhat difficult      Objective:   BP 122/80   Ht 5' 5.5" (1.664 m)   Wt 180 lb (81.6 kg)   BMI 29.50 kg/m  NAD. Alert, oriented. Lungs clear. Heart RRR. Thoughts logical, coherent and relevant. Dressed appropriately. Mildly anxious, but cheerful affect.   Assessment:   Problem List Items Addressed This Visit      Cardiovascular and Mediastinum   Migraine without aura and without status migrainosus, not intractable   Relevant Medications   sertraline (ZOLOFT) 100 MG  tablet   ACETAMINOPHEN-BUTALBITAL 50-325 MG TABS     Other   Analgesic rebound headache   Relevant Medications   sertraline (ZOLOFT) 100 MG tablet   ACETAMINOPHEN-BUTALBITAL 50-325 MG TABS   Depression with anxiety - Primary   Relevant Medications   sertraline (ZOLOFT) 100 MG tablet   Perimenopause   PMDD (premenstrual dysphoric disorder)   Relevant Medications   sertraline (ZOLOFT) 100 MG tablet          Plan:   Meds ordered this encounter  Medications  . sertraline (ZOLOFT) 100 MG tablet    Sig: Take 1.5 tablets (150 mg total) by mouth daily.    Dispense:  45 tablet    Refill:  5    Order Specific Question:   Supervising Provider    Answer:   Merlyn AlbertLUKING, WILLIAM S [[id number]]  . ACETAMINOPHEN-BUTALBITAL 50-325 MG TABS    Sig: TAKE 1-2 TABLETS EVERY 4-6 HOURS AS DIRECTED FOR HEADACHE    Dispense:  60 each    Refill:  5    Order Specific Question:   Supervising Provider    Answer:   Merlyn AlbertLUKING, WILLIAM S [[id number]]   Increase Topamax 25 mg that she has at home to 50 mg. Plan to titrate dose slowly until headaches improve or has side effects.  Our goal is to decrease the number of headaches and  wean her off her analgesic. Reminded patient that Topamax should not be taken if pregnant.  Discussed perimenopause, headaches and PMDD. Patient has had significant problems taking hormones in the past. Is not concerned about menses but would like some intervention for PMDD. Will need to research options based on her current regimen.  Return in about 3 months (around 11/07/2017) for recheck. Contact office in 2-3 weeks if no improvement in migraines.  25 minutes was spent with the patient.  This statement verifies that 25 minutes was indeed spent with the patient. Greater than half the time was spent in discussion, counseling and answering questions  regarding the issues that the patient came in for today as reflected in the diagnosis (s) please refer to documentation for further details.

## 2017-08-11 ENCOUNTER — Other Ambulatory Visit: Payer: Self-pay | Admitting: Nurse Practitioner

## 2017-08-11 ENCOUNTER — Encounter: Payer: Self-pay | Admitting: Nurse Practitioner

## 2017-08-11 MED ORDER — BUTALBITAL-APAP-CAFFEINE 50-325-40 MG PO TABS: 1.0000 | ORAL_TABLET | ORAL | 0 refills | Status: DC | PRN

## 2017-08-25 ENCOUNTER — Other Ambulatory Visit: Payer: Self-pay | Admitting: Nurse Practitioner

## 2017-08-25 MED ORDER — BUTALBITAL-APAP-CAFFEINE 50-325-40 MG PO TABS: 1.0000 | ORAL_TABLET | ORAL | 0 refills | Status: DC | PRN

## 2017-09-20 ENCOUNTER — Other Ambulatory Visit: Payer: Self-pay | Admitting: Family Medicine

## 2017-09-25 ENCOUNTER — Other Ambulatory Visit: Payer: Self-pay | Admitting: *Deleted

## 2017-09-25 MED ORDER — SERTRALINE HCL 100 MG PO TABS: 150.0000 mg | ORAL_TABLET | Freq: Every day | ORAL | 5 refills | Status: DC

## 2017-10-22 ENCOUNTER — Encounter: Payer: Self-pay | Admitting: Nurse Practitioner

## 2017-11-17 ENCOUNTER — Encounter: Payer: Self-pay | Admitting: Nurse Practitioner

## 2017-11-19 ENCOUNTER — Telehealth: Payer: BLUE CROSS/BLUE SHIELD | Admitting: Family

## 2017-11-19 ENCOUNTER — Encounter: Payer: Self-pay | Admitting: Nurse Practitioner

## 2017-11-19 DIAGNOSIS — Z7189 Other specified counseling: Secondary | ICD-10-CM

## 2017-11-19 NOTE — Progress Notes (Signed)
Thank you for the details you included in the comment boxes. Those details are very helpful in determining the best course of treatment for you and help us to provide the best care. The E-Visit program prohibits the treatment of any person under the age of 40. Also, treatments cannot be provided for any person aside from the person whose chart is connected to the visit itself (this is a legal issue). That being said, I would encourage you to reach out to her primary care provider or pediatrician tomorrow morning first thing and ask them if they will call in a prescription for you. Additionally, you can contact your provider through the My Chart website by logging into your daughter's profile if she has a [facility] Provider. We have discussed offering a new E-Visit program that treats children age 40 and up, but this has not yet been introduced. I wish I had better news, but the above options are your best choices for fast treatment for this situation. Additionally, I would use Abreva if you can get it tonight over-the-counter.   Based on what you shared with me it looks like you have a serious condition that should be evaluated in a face to face office visit.  NOTE: If you entered your credit card information for this eVisit, you will not be charged. You may see a "hold" on your card for the $30 but that hold will drop off and you will not have a charge processed.  If you are having a true medical emergency please call 911.  If you need an urgent face to face visit, [place] has four urgent care centers for your convenience.  If you need care fast and have a high deductible or no insurance consider:   [email or web address] to reserve your spot online an avoid wait times  [facility] [address], [address] [place], [place] [address] 8 am to 8 pm Monday-Friday 10 am to 4 pm Saturday-Sunday *Across the street from [employer]  [facility]  [address] [place]  [place], [id number] 8 am to 5 pm Monday-Friday * In the [facility] the [facility]   The following sites will take your  insurance:  . [facility]  [phone] Get Driving Directions Find a Provider at this Location  [address] [place], [place] [address] . 10 am to 8 pm Monday-Friday . 12 pm to 8 pm Saturday-Sunday   . [facility]  [phone] Get Driving Directions Find a Provider at this Location  [address] [place], [place] [address] . 8 am to 8 pm Monday-Friday . 9 am to 6 pm Saturday . 11 am to 6 pm Sunday   . [facility] [place]  [phone] Get Driving Directions  [id number] [place], [place] [address] . 8 am to 8 pm Monday-Friday . 8 am to 4 pm Saturday-Sunday   Your e-visit answers were reviewed by a board certified advanced clinical practitioner to complete your personal care plan.  Thank you for using e-Visits.

## 2017-11-28 ENCOUNTER — Other Ambulatory Visit: Payer: Self-pay | Admitting: Family Medicine

## 2017-12-06 ENCOUNTER — Other Ambulatory Visit: Payer: Self-pay | Admitting: Family Medicine

## 2017-12-06 MED ORDER — OMEPRAZOLE 40 MG PO CPDR: DELAYED_RELEASE_CAPSULE | ORAL | 2 refills | Status: DC

## 2017-12-13 ENCOUNTER — Telehealth: Payer: Self-pay | Admitting: Nurse Practitioner

## 2017-12-13 ENCOUNTER — Ambulatory Visit (INDEPENDENT_AMBULATORY_CARE_PROVIDER_SITE_OTHER): Payer: BLUE CROSS/BLUE SHIELD | Admitting: Nurse Practitioner

## 2017-12-13 VITALS — Ht 65.5 in | Wt 186.2 lb

## 2017-12-13 DIAGNOSIS — H1013 Acute atopic conjunctivitis, bilateral: Secondary | ICD-10-CM | POA: Diagnosis not present

## 2017-12-13 DIAGNOSIS — F418 Other specified anxiety disorders: Secondary | ICD-10-CM | POA: Diagnosis not present

## 2017-12-13 DIAGNOSIS — J309 Allergic rhinitis, unspecified: Secondary | ICD-10-CM

## 2017-12-13 DIAGNOSIS — G43009 Migraine without aura, not intractable, without status migrainosus: Secondary | ICD-10-CM

## 2017-12-13 MED ORDER — BUTALBITAL-APAP-CAFFEINE 50-325-40 MG PO TABS: 1.0000 | ORAL_TABLET | ORAL | 0 refills | Status: DC | PRN

## 2017-12-13 MED ORDER — SERTRALINE HCL 100 MG PO TABS: 150.0000 mg | ORAL_TABLET | Freq: Every day | ORAL | 1 refills | Status: AC

## 2017-12-13 MED ORDER — MONTELUKAST SODIUM 10 MG PO TABS: 10.0000 mg | ORAL_TABLET | Freq: Every day | ORAL | 1 refills | Status: DC

## 2017-12-13 MED ORDER — OMEPRAZOLE 40 MG PO CPDR: DELAYED_RELEASE_CAPSULE | ORAL | 1 refills | Status: AC

## 2017-12-13 NOTE — Telephone Encounter (Signed)
Patient wanted to remind Eber JonesCarolyn that she needs her Fioricet refilled.   [facility]

## 2017-12-13 NOTE — Patient Instructions (Signed)
Take a Pepcid daily

## 2017-12-13 NOTE — Telephone Encounter (Signed)
Done

## 2017-12-14 ENCOUNTER — Encounter: Payer: Self-pay | Admitting: Nurse Practitioner

## 2017-12-14 MED ORDER — CLOBETASOL PROPIONATE 0.05 % EX CREA: 1.0000 "application " | TOPICAL_CREAM | Freq: Two times a day (BID) | CUTANEOUS | 0 refills | Status: DC

## 2017-12-14 NOTE — Progress Notes (Signed)
Subjective: Presents for follow-up on several issues.  Is having migraines about 3/week.  Exacerbation began when her allergies started flaring up a few months ago.  Has identified this is the main trigger for her migraines.  No relief with Topamax 50 mg and patient has drowsiness the next morning.  Has had multiple antihistamines, currently on Allegra.  Does saline rinses and sprays on a regular basis.  Does a steroid nasal spray.  Has itchy watery eyes and sneezing especially after being outside.  Using OTC eyedrops for her eyes.  Has recurrent migratory itching and rash which resolves on its own.  Depression and anxiety have been stable on her current dose of Zoloft.  Objective:   Ht 5' 5.5" (1.664 m)   Wt 186 lb 3.2 oz (84.5 kg)   BMI 30.51 kg/m  NAD.  Alert, oriented.  TMs retracted, no erythema.  Mild puffiness noted around the eyes with mild allergic shiners bilaterally.  Pharynx non-erythematous with cloudy PND noted.  Neck supple with mild soft anterior adenopathy.  Lungs clear.  Heart regular rate and rhythm.  Thoughts logical coherent and relevant.  Dressed appropriately.  Making good eye contact.  Calm affect.  Assessment:   Problem List Items Addressed This Visit      Cardiovascular and Mediastinum   Migraine without aura and without status migrainosus, not intractable - Primary   Relevant Medications   sertraline (ZOLOFT) 100 MG tablet   butalbital-acetaminophen-caffeine (FIORICET, ESGIC) 50-325-40 MG tablet     Other   Depression with anxiety   Relevant Medications   sertraline (ZOLOFT) 100 MG tablet    Other Visit Diagnoses    Allergic conjunctivitis of both eyes and rhinitis       Relevant Orders   Ambulatory referral to Allergy       Plan:   Meds ordered this encounter  Medications  . sertraline (ZOLOFT) 100 MG tablet    Sig: Take 1.5 tablets (150 mg total) by mouth daily.    Dispense:  135 tablet    Refill:  1    Order Specific Question:   Supervising Provider     Answer:   Merlyn AlbertLUKING, WILLIAM S [[id number]]  . omeprazole (PRILOSEC) 40 MG capsule    Sig: TAKE ONE DAILY AS NEEDED FOR ACID REFLUX    Dispense:  90 capsule    Refill:  1    Order Specific Question:   Supervising Provider    Answer:   Merlyn AlbertLUKING, WILLIAM S [[id number]]  . montelukast (SINGULAIR) 10 MG tablet    Sig: Take 1 tablet (10 mg total) by mouth at bedtime.    Dispense:  90 tablet    Refill:  1    Order Specific Question:   Supervising Provider    Answer:   Merlyn AlbertLUKING, WILLIAM S [[id number]]  . butalbital-acetaminophen-caffeine (FIORICET, ESGIC) 50-325-40 MG tablet    Sig: Take 1 tablet by mouth every 4 (four) hours as needed for headache.    Dispense:  60 tablet    Refill:  0    Order Specific Question:   Supervising Provider    Answer:   Merlyn AlbertLUKING, WILLIAM S [[id number]]  . clobetasol cream (TEMOVATE) 0.05 %    Sig: Apply 1 application topically 2 (two) times daily. Prn rash up to 2 weeks at a time    Dispense:  30 g    Refill:  0    Order Specific Question:   Supervising Provider    Answer:   Riccardo DubinLUKING, WILLIAM S [[id number]]  Refer to allergy specialist.  In the meantime, add Singulair and Pepcid to her current regimen.  After the visit patient requested some steroid cream for an outbreak on her hand, clobetasol prescribed.  Continue Zoloft as directed.  If allergies are under good control and patient continues to have frequent migraines, recommend referral to neurology to discuss treatment options.  Plan to review her old records to see if other medicines have worked well in the past such as propranolol.  Warning signs reviewed.  Call back if symptoms worsen or persist. Return in about 6 months (around 06/15/2018) for recheck. 25 minutes was spent with the patient.  This statement verifies that 25 minutes was indeed spent with the patient.  More than 50% of this visit-total duration of the visit-was spent in counseling and coordination of care. The issues that the patient came in for today as reflected in the diagnosis  (s) please refer to documentation for further details.

## 2017-12-15 ENCOUNTER — Encounter (INDEPENDENT_AMBULATORY_CARE_PROVIDER_SITE_OTHER): Payer: Self-pay

## 2017-12-29 ENCOUNTER — Encounter: Payer: Self-pay | Admitting: Nurse Practitioner

## 2017-12-29 ENCOUNTER — Ambulatory Visit (INDEPENDENT_AMBULATORY_CARE_PROVIDER_SITE_OTHER): Payer: BLUE CROSS/BLUE SHIELD | Admitting: Nurse Practitioner

## 2017-12-29 VITALS — BP 128/88 | Temp 98.5°F | Ht 65.5 in | Wt 185.0 lb

## 2017-12-29 DIAGNOSIS — R609 Edema, unspecified: Secondary | ICD-10-CM | POA: Diagnosis not present

## 2017-12-29 DIAGNOSIS — R05 Cough: Secondary | ICD-10-CM

## 2017-12-29 DIAGNOSIS — T7840XA Allergy, unspecified, initial encounter: Secondary | ICD-10-CM | POA: Diagnosis not present

## 2017-12-29 MED ORDER — PREDNISONE 20 MG PO TABS: ORAL_TABLET | ORAL | 0 refills | Status: DC

## 2017-12-29 MED ORDER — METHYLPREDNISOLONE ACETATE 40 MG/ML IJ SUSP
40.0000 mg | Freq: Once | INTRAMUSCULAR | Status: AC
  Administered 2017-12-29: 40 mg via INTRAMUSCULAR

## 2017-12-29 NOTE — Patient Instructions (Signed)
Claritin 10 mg in the morning Benadryl 25 mg in the evening Pepcid daily

## 2017-12-30 ENCOUNTER — Encounter: Payer: Self-pay | Admitting: Nurse Practitioner

## 2017-12-30 NOTE — Progress Notes (Signed)
Subjective: Presents for complaints of localized swelling in the right hand that began yesterday.  Has had some itching in both palms off and on for the past 3 months.  Slight burning sensation.  No pain or fever.  No difficulty swallowing.  Has had a mild flareup of her wheezing, hard to take a deep breath without coughing.  Much better after using albuterol.  Patient relates her symptoms to starting generic Allegra which has a dye in it.  No other changes in her medication.  No known exposure to any identified allergens.  Objective:   BP 128/88   Temp 98.5 F (36.9 C) (Oral)   Ht 5' 5.5" (1.664 m)   Wt 185 lb (83.9 kg)   BMI 30.32 kg/m  NAD.  Alert, oriented.  TMs mild clear effusion, no erythema.  Pharynx clear.  Neck supple with minimal adenopathy.  Lungs clear.  No wheezing or tachypnea.  Has to take a deep breath at times with talking.  Normal color.  Heart regular rate rhythm.  Generalized edema of the right hand and fingers, does not extend above the wrist.  No erythema or significant warmth.  Nontender to palpation.  Strong radial pulse.  Fingers warm with normal capillary refill.  Assessment:  Allergic reaction, initial encounter - Plan: methylPREDNISolone acetate (DEPO-MEDROL) injection 40 mg    Plan:   Meds ordered this encounter  Medications  . predniSONE (DELTASONE) 20 MG tablet    Sig: 3 po qd x 3 d then 2 po qd x 3 d then 1 po qd x 2 d    Dispense:  17 tablet    Refill:  0    Order Specific Question:   Supervising Provider    Answer:   Merlyn AlbertLUKING, WILLIAM S [[id number]]  . methylPREDNISolone acetate (DEPO-MEDROL) injection 40 mg   OTC antihistamines and Pepcid as directed.  Warning signs reviewed.  Call back next week if no improvement, go to ED over the weekend if worse.

## 2018-01-03 ENCOUNTER — Other Ambulatory Visit: Payer: Self-pay | Admitting: Nurse Practitioner

## 2018-01-03 ENCOUNTER — Encounter: Payer: Self-pay | Admitting: Nurse Practitioner

## 2018-01-03 MED ORDER — BUTALBITAL-APAP-CAFFEINE 50-325-40 MG PO TABS: 1.0000 | ORAL_TABLET | ORAL | 0 refills | Status: DC | PRN

## 2018-01-15 ENCOUNTER — Telehealth: Payer: Self-pay | Admitting: Family Medicine

## 2018-01-15 MED ORDER — CLOBETASOL PROPIONATE 0.05 % EX CREA: 1.0000 "application " | TOPICAL_CREAM | Freq: Two times a day (BID) | CUTANEOUS | 3 refills | Status: DC

## 2018-01-15 NOTE — Telephone Encounter (Signed)
[facility],asked that she pls call and confirm that she received the message.

## 2018-01-15 NOTE — Telephone Encounter (Signed)
Fax from pharmacy needing refills on Clobetasol 0.05% cream; apply one application topically twice daily as needed for rash up to two weeks at a time.

## 2018-01-15 NOTE — Telephone Encounter (Signed)
Patient is aware 

## 2018-01-15 NOTE — Addendum Note (Signed)
Addended by: Joanette GulaSUTTON, Jaelin Fackler L on: 01/15/2018 01:00 PM   Modules accepted: Orders

## 2018-01-15 NOTE — Telephone Encounter (Signed)
Ok plus 3 ref 

## 2018-01-18 ENCOUNTER — Encounter: Payer: Self-pay | Admitting: Family Medicine

## 2018-01-18 ENCOUNTER — Other Ambulatory Visit: Payer: Self-pay | Admitting: Family Medicine

## 2018-01-18 MED ORDER — PREDNISONE 20 MG PO TABS: ORAL_TABLET | ORAL | 0 refills | Status: DC

## 2018-01-18 MED ORDER — HYDROXYZINE HCL 25 MG PO TABS: 25.0000 mg | ORAL_TABLET | Freq: Three times a day (TID) | ORAL | 2 refills | Status: DC | PRN

## 2018-01-29 ENCOUNTER — Encounter: Payer: Self-pay | Admitting: Allergy & Immunology

## 2018-01-30 ENCOUNTER — Encounter: Payer: Self-pay | Admitting: Allergy and Immunology

## 2018-01-30 ENCOUNTER — Ambulatory Visit (INDEPENDENT_AMBULATORY_CARE_PROVIDER_SITE_OTHER): Payer: BLUE CROSS/BLUE SHIELD | Admitting: Allergy and Immunology

## 2018-01-30 ENCOUNTER — Ambulatory Visit: Payer: BLUE CROSS/BLUE SHIELD | Admitting: Allergy and Immunology

## 2018-01-30 VITALS — BP 118/90 | HR 79 | Temp 98.4°F | Resp 20 | Ht 64.3 in | Wt 181.4 lb

## 2018-01-30 DIAGNOSIS — T7840XA Allergy, unspecified, initial encounter: Secondary | ICD-10-CM | POA: Insufficient documentation

## 2018-01-30 DIAGNOSIS — J3089 Other allergic rhinitis: Secondary | ICD-10-CM | POA: Diagnosis not present

## 2018-01-30 DIAGNOSIS — J4520 Mild intermittent asthma, uncomplicated: Secondary | ICD-10-CM | POA: Insufficient documentation

## 2018-01-30 DIAGNOSIS — H1010 Acute atopic conjunctivitis, unspecified eye: Secondary | ICD-10-CM | POA: Insufficient documentation

## 2018-01-30 DIAGNOSIS — T783XXD Angioneurotic edema, subsequent encounter: Secondary | ICD-10-CM

## 2018-01-30 DIAGNOSIS — T7840XD Allergy, unspecified, subsequent encounter: Secondary | ICD-10-CM | POA: Diagnosis not present

## 2018-01-30 DIAGNOSIS — T783XXA Angioneurotic edema, initial encounter: Secondary | ICD-10-CM | POA: Insufficient documentation

## 2018-01-30 DIAGNOSIS — H1013 Acute atopic conjunctivitis, bilateral: Secondary | ICD-10-CM

## 2018-01-30 DIAGNOSIS — L500 Allergic urticaria: Secondary | ICD-10-CM | POA: Insufficient documentation

## 2018-01-30 MED ORDER — LEVOCETIRIZINE DIHYDROCHLORIDE 5 MG PO TABS: 5.0000 mg | ORAL_TABLET | Freq: Every day | ORAL | 5 refills | Status: DC | PRN

## 2018-01-30 MED ORDER — EPINEPHRINE 0.3 MG/0.3ML IJ SOAJ: 0.3000 mg | Freq: Once | INTRAMUSCULAR | 2 refills | Status: AC

## 2018-01-30 MED ORDER — OLOPATADINE HCL 0.7 % OP SOLN: 1.0000 [drp] | Freq: Every day | OPHTHALMIC | 5 refills | Status: DC | PRN

## 2018-01-30 MED ORDER — RANITIDINE HCL 150 MG PO TABS: 150.0000 mg | ORAL_TABLET | Freq: Two times a day (BID) | ORAL | 5 refills | Status: DC | PRN

## 2018-01-30 MED ORDER — FLUTICASONE PROPIONATE 93 MCG/ACT NA EXHU: 2.0000 | INHALANT_SUSPENSION | Freq: Two times a day (BID) | NASAL | 5 refills | Status: DC

## 2018-01-30 NOTE — Assessment & Plan Note (Signed)
   For now, continue montelukast and albuterol HFA, 1 to 2 inhalations every 6 hours if needed and 15 minutes prior to vigorous exercise.  Subjective and objective measures of pulmonary function will be followed and the treatment plan will be adjusted accordingly.

## 2018-01-30 NOTE — Assessment & Plan Note (Addendum)
Unclear etiology.  Given the concomitant flare of allergic rhinoconjunctivitis and allergic/exercise-induced bronchospasm, this may be secondary to aeroallergen exposure. Skin tests to select food allergens were negative today.  The negative predictive value for food allergen skin testing is excellent, approximately 95%.  NSAIDs and emotional stress commonly exacerbate urticaria/angioedema but are not the underlying etiology in this case. Physical urticarias are negative by history (i.e. pressure-induced, temperature, vibration, solar, etc.). History and lesions are not consistent with urticaria pigmentosa so I am not suspicious for mastocytosis. There are no concomitant symptoms concerning for anaphylaxis or constitutional symptoms worrisome for an underlying malignancy.   We will not order labs at this time, however, if lesions recur, persist, progress, or change in character in the absence of dog/pollen/mold exposure, we will assess potential etiologies with screening labs.  For symptom relief, patient is to take oral antihistamines as directed.  A prescription has been provided for levocetirizine 5 mg daily as needed.  A prescription has been provided for ranitidine 150 mg twice daily as needed.    For now, continue montelukast 10 mg daily at bedtime.  Should symptoms recur in the absence of dog, pollen, mold exposure, a journal is to be kept recording any foods eaten, beverages consumed, medications taken within a 6 hour period prior to the onset of symptoms, as well as record activities being performed, and environmental conditions. For any symptoms concerning for anaphylaxis, epinephrine is to be administered and 911 is to be called immediately.  A prescription has been provided for epinephrine 0.3 mg autoinjector (Auvi-Q) 2 pack along with instructions for its proper administration.

## 2018-01-30 NOTE — Assessment & Plan Note (Addendum)
   Aeroallergen avoidance measures have been discussed and provided in written form.  Levocetirizine as needed and montelukast daily (as above).  A prescription has been provided for [facility], 2 actuations per nostril twice a day. Proper technique has been discussed and demonstrated.  Nasal saline spray (i.e., Simply Saline) or nasal saline lavage (i.e., NeilMed) is recommended as needed and prior to medicated nasal sprays.  The risks and benefits of aeroallergen immunotherapy have been discussed. The patient is motivated to initiate immunotherapy if insurance coverage is favorable. She will let us know how she would like to proceed.

## 2018-01-30 NOTE — Progress Notes (Signed)
New Patient Note  RE: [name] MRN: [id number] DOB: 1975-07-13 Date of Office Visit: 01/30/2018  Referring provider: Merlyn Albert, MD Primary care provider: Merlyn Albert, MD  Chief Complaint: Allergic Reaction   History of present illness: [name] is a 42 y.o. female seen today in consultation requested by Sherie Don, NP.  She reports that on December 22, 2017 she had "a major allergic reaction to something".  She woke up that morning and noted that her right hand was swollen and exquisitely pruritic.  She went to her primary care physician when was started on montelukast, loratadine, given a corticosteroid injection, and a prednisone taper.  The symptoms improved for several days, however came back a few days after the prednisone course ended.  She was given another round of prednisone prior to going on a family vacation to the beach and again the symptoms improved.  However he once again returned with pruritus of the hands and feet and mild swelling of the right foot.  She has not experienced joint pain, fevers, chills, or unexpected weight loss.  She has not been able to identify a trigger, however she suspects the possibility of an environmental allergen due to the fact that regarding her nasal and ocular allergies "this is been the worst year ever" and reports that she "cannot get them under control."  She also notes that her exercise-induced bronchospasm has been worse this year and seems to be more active since she started having the allergic reactions in mid July.  She is also curious about the possibility of red food dye #40 as she had a coughing fit after consuming red velvet cake.  She believes that her nasal and ocular allergy symptoms are triggered by pollen, molds, and cats.  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  In the past she has attempted to control her nasal and ocular allergies with over-the-counter antihistamines and has  taken albuterol as needed for the exercise-induced bronchospasm.  Assessment and plan: Urticaria/angioedema Unclear etiology.  Given the concomitant flare of allergic rhinoconjunctivitis and allergic/exercise-induced bronchospasm, this may be secondary to aeroallergen exposure. Skin tests to select food allergens were negative today.  The negative predictive value for food allergen skin testing is excellent, approximately 95%.  NSAIDs and emotional stress commonly exacerbate urticaria/angioedema but are not the underlying etiology in this case. Physical urticarias are negative by history (i.e. pressure-induced, temperature, vibration, solar, etc.). History and lesions are not consistent with urticaria pigmentosa so I am not suspicious for mastocytosis. There are no concomitant symptoms concerning for anaphylaxis or constitutional symptoms worrisome for an underlying malignancy.   We will not order labs at this time, however, if lesions recur, persist, progress, or change in character in the absence of dog/pollen/mold exposure, we will assess potential etiologies with screening labs.  For symptom relief, patient is to take oral antihistamines as directed.  A prescription has been provided for levocetirizine 5 mg daily as needed.  A prescription has been provided for ranitidine 150 mg twice daily as needed.    For now, continue montelukast 10 mg daily at bedtime.  Should symptoms recur in the absence of dog, pollen, mold exposure, a journal is to be kept recording any foods eaten, beverages consumed, medications taken within a 6 hour period prior to the onset of symptoms, as well as record activities being performed, and environmental conditions. For any symptoms concerning for anaphylaxis, epinephrine is to  be administered and 911 is to be called immediately.  A prescription has been provided for epinephrine 0.3 mg autoinjector (Auvi-Q) 2 pack along with instructions for its proper  administration.  Seasonal and perennial allergic rhinitis  Aeroallergen avoidance measures have been discussed and provided in written form.  Levocetirizine as needed and montelukast daily (as above).  A prescription has been provided for [facility], 2 actuations per nostril twice a day. Proper technique has been discussed and demonstrated.  Nasal saline spray (i.e., Simply Saline) or nasal saline lavage (i.e., NeilMed) is recommended as needed and prior to medicated nasal sprays.  The risks and benefits of aeroallergen immunotherapy have been discussed. The patient is motivated to initiate immunotherapy if insurance coverage is favorable. She will let us know how she would like to proceed.  Allergic conjunctivitis  Treatment plan as outlined above for allergic rhinitis.  A prescription has been provided for Pazeo, one drop per eye daily as needed.  I have also recommended eye lubricant drops (i.e., Natural Tears) as needed.  Exercise-induced bronchospasm  For now, continue montelukast and albuterol HFA, 1 to 2 inhalations every 6 hours if needed and 15 minutes prior to vigorous exercise.  Subjective and objective measures of pulmonary function will be followed and the treatment plan will be adjusted accordingly.   Meds ordered this encounter  Medications  . EPINEPHrine (AUVI-Q) 0.3 mg/0.3 mL IJ SOAJ injection    Sig: Inject 0.3 mLs (0.3 mg total) into the muscle once for 1 dose. As directed for life-threatening allergic reactions    Dispense:  4 Device    Refill:  2  . levocetirizine (XYZAL) 5 MG tablet    Sig: Take 1 tablet (5 mg total) by mouth daily as needed for allergies.    Dispense:  30 tablet    Refill:  5  . ranitidine (ZANTAC) 150 MG tablet    Sig: Take 1 tablet (150 mg total) by mouth 2 (two) times daily as needed for heartburn.    Dispense:  60 tablet    Refill:  5  . Fluticasone Propionate (XHANCE) 93 MCG/ACT EXHU    Sig: Place 2 sprays into the nose 2 (two)  times daily.    Dispense:  16 mL    Refill:  5  . Olopatadine HCl (PAZEO) 0.7 % SOLN    Sig: Place 1 drop into both eyes daily as needed.    Dispense:  1 Bottle    Refill:  5    Diagnostics: Spirometry: Normal with an FEV1 of 95% predicted.  Please see scanned spirometry results for details. Environmental skin testing: Positive to grass pollen, weed pollen, ragweed pollen, tree pollen, molds, cat hair, dog epithelia, cockroach antigen, and dust mite antigen. Food allergen skin testing: Negative despite a positive histamine control.    Physical examination: Blood pressure 118/90, pulse 79, temperature 98.4 F (36.9 C), temperature source Oral, resp. rate 20, height 5' 4.3" (1.633 m), weight 181 lb 6.4 oz (82.3 kg), SpO2 97 %, unknown if currently breastfeeding.  General: Alert, interactive, in no acute distress. HEENT: TMs pearly gray, turbinates edematous with thick discharge, post-pharynx moderately erythematous. Neck: Supple without lymphadenopathy. Lungs: Clear to auscultation without wheezing, rhonchi or rales. CV: Normal S1, S2 without murmurs. Abdomen: Nondistended, nontender. Skin: Warm and dry, without lesions or rashes. Extremities:  No clubbing, cyanosis or edema. Neuro:   Grossly intact.  Review of systems:  Review of systems negative except as noted in HPI / PMHx or noted below: Review  of Systems  Constitutional: Negative.   HENT: Negative.   Eyes: Negative.   Respiratory: Negative.   Cardiovascular: Negative.   Gastrointestinal: Negative.   Genitourinary: Negative.   Musculoskeletal: Negative.   Skin: Negative.   Neurological: Negative.   Endo/Heme/Allergies: Negative.   Psychiatric/Behavioral: Negative.     Past medical history:  Past Medical History:  Diagnosis Date  . Asthma   . Depression   . Gestational diabetes   . Headaches due to old head injury   . Insomnia   . Nausea   . Urticaria     Past surgical history:  Past Surgical History:   Procedure Laterality Date  . COLONOSCOPY N/A 09/12/2014   SLF: 1. No obvious source for diarrhea/ abdominal pain identified. IIt's most likely due to IBS-D. 2. Mild diverticulosis in the right colon 3. Moderate sized external hemorrhoids.   . none to date     08/18/14    Family history: Family History  Problem Relation Age of Onset  . Allergic rhinitis Mother   . Asthma Brother   . Colon cancer Neg Hx   . Eczema Neg Hx   . Urticaria Neg Hx     Social history: Social History   Socioeconomic History  . Marital status: Married    Spouse name: Not on file  . Number of children: Not on file  . Years of education: Not on file  . Highest education level: Not on file  Occupational History  . Not on file  Social Needs  . Financial resource strain: Not on file  . Food insecurity:    Worry: Not on file    Inability: Not on file  . Transportation needs:    Medical: Not on file    Non-medical: Not on file  Tobacco Use  . Smoking status: Former Games developermoker  . Smokeless tobacco: Never Used  Substance and Sexual Activity  . Alcohol use: Yes    Comment: rare social   . Drug use: No  . Sexual activity: Yes    Birth control/protection: Condom  Lifestyle  . Physical activity:    Days per week: Not on file    Minutes per session: Not on file  . Stress: Not on file  Relationships  . Social connections:    Talks on phone: Not on file    Gets together: Not on file    Attends religious service: Not on file    Active member of club or organization: Not on file    Attends meetings of clubs or organizations: Not on file    Relationship status: Not on file  . Intimate partner violence:    Fear of current or ex partner: Not on file    Emotionally abused: Not on file    Physically abused: Not on file    Forced sexual activity: Not on file  Other Topics Concern  . Not on file  Social History Narrative  . Not on file   Environmental History: Patient lives in a 40 year old house with  laminate floors throughout and central air/heat.  There is no known mold/water damage in the home.  There is a dog in the home which has access to her bedroom.  She is a non-smoker.  Allergies as of 01/30/2018      Reactions   Amoxicillin-pot Clavulanate Other (See Comments)   Makes colon bleed   Cefuroxime Axetil Other (See Comments)   GI upset makes colon bleed   Cephalosporins    GI upset  Levaquin [levofloxacin In D5w] Other (See Comments)   Colitis, GI upset, causes colon to bleed   Temazepam Diarrhea   Latex Rash, Other (See Comments)   Creates water blisters      Medication List        Accurate as of 01/30/18  1:46 PM. Always use your most recent med list.          butalbital-acetaminophen-caffeine 50-325-40 MG tablet Commonly known as:  FIORICET, ESGIC Take 1 tablet by mouth every 4 (four) hours as needed for headache.   cetirizine 10 MG tablet Commonly known as:  ZYRTEC Take 10 mg by mouth daily. Alternates with claritin   clobetasol cream 0.05 % Commonly known as:  TEMOVATE Apply 1 application topically 2 (two) times daily. Prn rash up to 2 weeks at a time   EPINEPHrine 0.3 mg/0.3 mL Soaj injection Commonly known as:  EPI-PEN Inject 0.3 mLs (0.3 mg total) into the muscle once for 1 dose. As directed for life-threatening allergic reactions   Fluticasone Propionate 93 MCG/ACT Exhu Place 2 sprays into the nose 2 (two) times daily.   hydrOXYzine 25 MG tablet Commonly known as:  ATARAX/VISTARIL Take 1 tablet (25 mg total) by mouth 3 (three) times daily as needed. As needed for itching caution drowsiness   levocetirizine 5 MG tablet Commonly known as:  XYZAL Take 1 tablet (5 mg total) by mouth daily as needed for allergies.   montelukast 10 MG tablet Commonly known as:  SINGULAIR Take 1 tablet (10 mg total) by mouth at bedtime.   Olopatadine HCl 0.7 % Soln Place 1 drop into both eyes daily as needed.   omeprazole 40 MG capsule Commonly known as:   PRILOSEC TAKE ONE DAILY AS NEEDED FOR ACID REFLUX   ranitidine 150 MG tablet Commonly known as:  ZANTAC Take 1 tablet (150 mg total) by mouth 2 (two) times daily as needed for heartburn.   sertraline 100 MG tablet Commonly known as:  ZOLOFT Take 1.5 tablets (150 mg total) by mouth daily.   VIBERZI 100 MG Tabs Generic drug:  Eluxadoline TAKE 1 TABLET BY MOUTH TWICE A DAY WITH A MEAL       Known medication allergies: Allergies  Allergen Reactions  . Amoxicillin-Pot Clavulanate Other (See Comments)    Makes colon bleed  . Cefuroxime Axetil Other (See Comments)    GI upset makes colon bleed  . Cephalosporins     GI upset  . Levaquin [Levofloxacin In D5w] Other (See Comments)    Colitis, GI upset, causes colon to bleed  . Temazepam Diarrhea  . Latex Rash and Other (See Comments)    Creates water blisters    I appreciate the opportunity to take part in Margy's care. Please do not hesitate to contact me with questions.  Sincerely,   R. Jorene Guest, MD

## 2018-01-30 NOTE — Patient Instructions (Addendum)
Urticaria/angioedema Unclear etiology.  Given the concomitant flare of allergic rhinoconjunctivitis and allergic/exercise-induced bronchospasm, this may be secondary to aeroallergen exposure. Skin tests to select food allergens were negative today.  The negative predictive value for food allergen skin testing is excellent, approximately 95%.  NSAIDs and emotional stress commonly exacerbate urticaria/angioedema but are not the underlying etiology in this case. Physical urticarias are negative by history (i.e. pressure-induced, temperature, vibration, solar, etc.). History and lesions are not consistent with urticaria pigmentosa so I am not suspicious for mastocytosis. There are no concomitant symptoms concerning for anaphylaxis or constitutional symptoms worrisome for an underlying malignancy.   We will not order labs at this time, however, if lesions recur, persist, progress, or change in character in the absence of dog/pollen/mold exposure, we will assess potential etiologies with screening labs.  For symptom relief, patient is to take oral antihistamines as directed.  A prescription has been provided for levocetirizine 5 mg daily as needed.  A prescription has been provided for ranitidine 150 mg twice daily as needed.    For now, continue montelukast 10 mg daily at bedtime.  Should symptoms recur in the absence of dog, pollen, mold exposure, a journal is to be kept recording any foods eaten, beverages consumed, medications taken within a 6 hour period prior to the onset of symptoms, as well as record activities being performed, and environmental conditions. For any symptoms concerning for anaphylaxis, epinephrine is to be administered and 911 is to be called immediately.  A prescription has been provided for epinephrine 0.3 mg autoinjector (Auvi-Q) 2 pack along with instructions for its proper administration.  Seasonal and perennial allergic rhinitis  Aeroallergen avoidance measures have been  discussed and provided in written form.  Levocetirizine as needed and montelukast daily (as above).  A prescription has been provided for [facility], 2 actuations per nostril twice a day. Proper technique has been discussed and demonstrated.  Nasal saline spray (i.e., Simply Saline) or nasal saline lavage (i.e., NeilMed) is recommended as needed and prior to medicated nasal sprays.  The risks and benefits of aeroallergen immunotherapy have been discussed. The patient is motivated to initiate immunotherapy if insurance coverage is favorable. She will let us know how she would like to proceed.  Allergic conjunctivitis  Treatment plan as outlined above for allergic rhinitis.  A prescription has been provided for Pazeo, one drop per eye daily as needed.  I have also recommended eye lubricant drops (i.e., Natural Tears) as needed.  Exercise-induced bronchospasm  For now, continue montelukast and albuterol HFA, 1 to 2 inhalations every 6 hours if needed and 15 minutes prior to vigorous exercise.  Subjective and objective measures of pulmonary function will be followed and the treatment plan will be adjusted accordingly.   Return in about 4 months (around 06/01/2018), or if symptoms worsen or fail to improve.  Reducing Pollen Exposure  The American Academy of Allergy, Asthma and Immunology suggests the following steps to reduce your exposure to pollen during allergy seasons.    1. Do not hang sheets or clothing out to dry; pollen may collect on these items. 2. Do not mow lawns or spend time around freshly cut grass; mowing stirs up pollen. 3. Keep windows closed at night.  Keep car windows closed while driving. 4. Minimize morning activities outdoors, a time when pollen counts are usually at their highest. 5. Stay indoors as much as possible when pollen counts or humidity is high and on windy days when pollen tends to remain in the  air longer. 6. Use air conditioning when possible.  Many air  conditioners have filters that trap the pollen spores. 7. Use a HEPA room air filter to remove pollen form the indoor air you breathe.   Control of House Dust Mite Allergen  House dust mites play a major role in allergic asthma and rhinitis.  They occur in environments with high humidity wherever human skin, the food for dust mites is found. High levels have been detected in dust obtained from mattresses, pillows, carpets, upholstered furniture, bed covers, clothes and soft toys.  The principal allergen of the house dust mite is found in its feces.  A gram of dust may contain 1,000 mites and 250,000 fecal particles.  Mite antigen is easily measured in the air during house cleaning activities.    1. Encase mattresses, including the box spring, and pillow, in an air tight cover.  Seal the zipper end of the encased mattresses with wide adhesive tape. 2. Wash the bedding in water of 130 degrees Farenheit weekly.  Avoid cotton comforters/quilts and flannel bedding: the most ideal bed covering is the dacron comforter. 3. Remove all upholstered furniture from the bedroom. 4. Remove carpets, carpet padding, rugs, and non-washable window drapes from the bedroom.  Wash drapes weekly or use plastic window coverings. 5. Remove all non-washable stuffed toys from the bedroom.  Wash stuffed toys weekly. 6. Have the room cleaned frequently with a vacuum cleaner and a damp dust-mop.  The patient should not be in a room which is being cleaned and should wait 1 hour after cleaning before going into the room. 7. Close and seal all heating outlets in the bedroom.  Otherwise, the room will become filled with dust-laden air.  An electric heater can be used to heat the room. Reduce indoor humidity to less than 50%.  Do not use a humidifier.  Control of Dog or Cat Allergen  Avoidance is the best way to manage a dog or cat allergy. If you have a dog or cat and are allergic to dog or cats, consider removing the dog or cat  from the home. If you have a dog or cat but don't want to find it a new home, or if your family wants a pet even though someone in the household is allergic, here are some strategies that may help keep symptoms at bay:  1. Keep the pet out of your bedroom and restrict it to only a few rooms. Be advised that keeping the dog or cat in only one room will not limit the allergens to that room. 2. Don't pet, hug or kiss the dog or cat; if you do, wash your hands with soap and water. 3. High-efficiency particulate air (HEPA) cleaners run continuously in a bedroom or living room can reduce allergen levels over time. 4. Place electrostatic material sheet in the air inlet vent in the bedroom. 5. Regular use of a high-efficiency vacuum cleaner or a central vacuum can reduce allergen levels. 6. Giving your dog or cat a bath at least once a week can reduce airborne allergen.  Control of Mold Allergen  Mold and fungi can grow on a variety of surfaces provided certain temperature and moisture conditions exist.  Outdoor molds grow on plants, decaying vegetation and soil.  The major outdoor mold, Alternaria and Cladosporium, are found in very high numbers during hot and dry conditions.  Generally, a late Summer - Fall peak is seen for common outdoor fungal spores.  Rain will temporarily lower  outdoor mold spore count, but counts rise rapidly when the rainy period ends.  The most important indoor molds are Aspergillus and Penicillium.  Dark, humid and poorly ventilated basements are ideal sites for mold growth.  The next most common sites of mold growth are the bathroom and the kitchen.  Outdoor MicrosoftMold Control 1. Use air conditioning and keep windows closed 2. Avoid exposure to decaying vegetation. 3. Avoid leaf raking. 4. Avoid grain handling. 5. Consider wearing a face mask if working in moldy areas.  Indoor Mold Control 1. Maintain humidity below 50%. 2. Clean washable surfaces with 5% bleach  solution. 3. Remove sources e.g. Contaminated carpets.  Control of Cockroach Allergen  Cockroach allergen has been identified as an important cause of acute attacks of asthma, especially in urban settings.  There are fifty-five species of cockroach that exist in the [place], however only three, the [place], [place] and Oriental species produce allergen that can affect patients with Asthma.  Allergens can be obtained from fecal particles, egg casings and secretions from cockroaches.    1. Remove food sources. 2. Reduce access to water. 3. Seal access and entry points. 4. Spray runways with 0.5-1% Diazinon or Chlorpyrifos 5. Blow boric acid power under stoves and refrigerator. 6. Place bait stations (hydramethylnon) at feeding sites.

## 2018-01-30 NOTE — Assessment & Plan Note (Signed)
   Treatment plan as outlined above for allergic rhinitis.  A prescription has been provided for Pazeo, one drop per eye daily as needed.  I have also recommended eye lubricant drops (i.e., Natural Tears) as needed. 

## 2018-02-01 ENCOUNTER — Telehealth: Payer: Self-pay | Admitting: Allergy and Immunology

## 2018-02-01 NOTE — Telephone Encounter (Signed)
Dr. Nunzio CobbsBobbitt can we send in olopatadine 0.2% to see if that will be more cost effective? Please advise thank you.

## 2018-02-01 NOTE — Telephone Encounter (Signed)
Patient was seen yesterday, 01-31-18, and was prescribed an eyedrop. She went to pick it up and it was over $130. She would like to know if there is an alternative. [facility] [place] on [address] She is leaving out of town tomorrow and would like this by then.

## 2018-02-01 NOTE — Telephone Encounter (Signed)
yes

## 2018-02-02 MED ORDER — OLOPATADINE HCL 0.2 % OP SOLN: 1.0000 [drp] | Freq: Every day | OPHTHALMIC | 5 refills | Status: DC | PRN

## 2018-02-02 NOTE — Telephone Encounter (Signed)
Prescription change has been completed and new rx sent to pharmacy. Patient is not aware at this time.

## 2018-02-02 NOTE — Telephone Encounter (Signed)
Detailed message left for patient advising of med change.

## 2018-02-16 ENCOUNTER — Other Ambulatory Visit: Payer: Self-pay | Admitting: *Deleted

## 2018-02-16 MED ORDER — BUTALBITAL-APAP-CAFFEINE 50-325-40 MG PO TABS: 1.0000 | ORAL_TABLET | ORAL | 0 refills | Status: AC | PRN

## 2018-02-16 NOTE — Telephone Encounter (Signed)
Ok times one 

## 2018-03-22 ENCOUNTER — Other Ambulatory Visit: Payer: Self-pay | Admitting: Allergy and Immunology

## 2018-04-02 ENCOUNTER — Encounter: Payer: Self-pay | Admitting: Family Medicine

## 2018-04-03 ENCOUNTER — Encounter: Payer: Self-pay | Admitting: Family Medicine

## 2018-04-03 ENCOUNTER — Ambulatory Visit (INDEPENDENT_AMBULATORY_CARE_PROVIDER_SITE_OTHER): Payer: BLUE CROSS/BLUE SHIELD | Admitting: Family Medicine

## 2018-04-03 VITALS — BP 138/84 | Ht 65.0 in | Wt 173.8 lb

## 2018-04-03 DIAGNOSIS — M25511 Pain in right shoulder: Secondary | ICD-10-CM

## 2018-04-03 DIAGNOSIS — G542 Cervical root disorders, not elsewhere classified: Secondary | ICD-10-CM | POA: Diagnosis not present

## 2018-04-03 MED ORDER — HYDROCODONE-ACETAMINOPHEN 5-325 MG PO TABS: ORAL_TABLET | ORAL | 0 refills | Status: DC

## 2018-04-03 MED ORDER — PREDNISONE 20 MG PO TABS: ORAL_TABLET | ORAL | 0 refills | Status: DC

## 2018-04-03 NOTE — Progress Notes (Signed)
   Subjective:    Patient ID: [name], female    DOB: [birth year], 42 y.o.   MRN: [id number]  HPI  Patient is here today with complaints of right shoulder pain and numbness in fingertips/forearm. Started 4 weeks ago.   Tried massage therapy, chiropractor, hot and cold compress.   Headaches got more painful in the sumer , went to massage thrapy for her headaches  After one session woke up with shoulder in bad pain, excrutiatind g   Has hx of should flaring up in the past, deep ache,  steroied inject helped I  The past  Pt has contd massage therapy twice per wk for thirty iutes   Now not heping   Went to [employer] did not help  Alt hot cold and alt  strtched not doing much      very bad pain, taking ibu my mouth  Using bayre  Anti inflam nt helping muh   Review of Systems No headache, no major weight loss or weight gain, no chest pain no back pain abdominal pain no change in bowel habits complete ROS otherwise negative     Objective:   Physical Exam  Alert vitals stable, NAD. Blood pressure good on repeat. HEENT normal. Lungs clear. Heart regular rate and rhythm. No true impingement sign on shoulder evaluation.  Some tenderness with more movement of the neck.  Distal hand strength sensation all intact currently.      Assessment & Plan:  Impression neck shoulder arm discomfort.  Patient feels based on shoulder.  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  Discussed.  Will give trial of steroids.  Local measures discussed.  Follow-up scheduled if persists may need further work-up.  I think the next step will be MRI of cervical spine that depending on response

## 2018-04-06 ENCOUNTER — Other Ambulatory Visit: Payer: Self-pay | Admitting: Family Medicine

## 2018-04-06 ENCOUNTER — Encounter: Payer: Self-pay | Admitting: Family Medicine

## 2018-04-06 MED ORDER — TIZANIDINE HCL 2 MG PO CAPS: ORAL_CAPSULE | ORAL | 0 refills | Status: DC

## 2018-04-06 NOTE — Telephone Encounter (Signed)
Nurses 1.  Call patient #2 put on her medication allergy list hydrocodone-hives 3.  For this patient unfortunately tramadol and oxycodone codon can also cause allergy related issues 4.  May use ibuprofen 200 mg 3 tablets every 6 hours as needed for pain 5.  May try Zanaflex 2 mg, 1 3 times daily as needed spasms home use only #15 #6 if patient not gradually better with the prednisone over the next week I recommend follow-up office visit

## 2018-04-09 ENCOUNTER — Telehealth: Payer: Self-pay | Admitting: Family Medicine

## 2018-04-09 NOTE — Telephone Encounter (Signed)
Pt was thinking it was from her shoulder, I was thinking more likely cervical spine, reason I rec visit to clarify current status,pt needs to be aware that insurances are frequently denying MRIs now unless we give things sufficient time to clear up, where does she think the mri should be done?(neck or shoulder?)

## 2018-04-09 NOTE — Telephone Encounter (Signed)
Patient was seen 04/03/18 w/ Dr.Steve for shoulder pain, pt states in the visit it was discussed if pain doesn't seem to subside she would be able to get an MRI completed pt was offered and office visit for 04/10/18 to follow up from unbearable pain, pt denied and is requesting if an order for MRI can be placed. Advise.

## 2018-04-09 NOTE — Telephone Encounter (Signed)
Patient aware of all and was transferred up front to set up an appointment for tomorrow.

## 2018-04-09 NOTE — Telephone Encounter (Signed)
Please advise 

## 2018-04-10 ENCOUNTER — Ambulatory Visit (INDEPENDENT_AMBULATORY_CARE_PROVIDER_SITE_OTHER): Payer: BLUE CROSS/BLUE SHIELD | Admitting: Family Medicine

## 2018-04-10 ENCOUNTER — Encounter: Payer: Self-pay | Admitting: Family Medicine

## 2018-04-10 ENCOUNTER — Ambulatory Visit (HOSPITAL_COMMUNITY)
Admission: RE | Admit: 2018-04-10 | Discharge: 2018-04-10 | Disposition: A | Discharge status: Home / Self Care | Payer: BLUE CROSS/BLUE SHIELD | Source: Ambulatory Visit | Attending: Family Medicine | Admitting: Family Medicine

## 2018-04-10 VITALS — BP 118/82 | Ht 65.0 in | Wt 179.0 lb

## 2018-04-10 DIAGNOSIS — M50323 Other cervical disc degeneration at C6-C7 level: Secondary | ICD-10-CM | POA: Diagnosis not present

## 2018-04-10 DIAGNOSIS — G542 Cervical root disorders, not elsewhere classified: Secondary | ICD-10-CM

## 2018-04-10 DIAGNOSIS — M542 Cervicalgia: Secondary | ICD-10-CM | POA: Diagnosis present

## 2018-04-10 NOTE — Progress Notes (Signed)
   Subjective:    Patient ID: [name], female    DOB: June 03, 1976, 42 y.o.   MRN: [id number]  HPI Patient arrives for a follow up on right shoulder pain. Patient states she is still having problems with right shoulder.  pred taper has not   Pain is most sore upper shoulder, supraclavic  Not sure is radiating fro neck to shoulder or shoulder upards   deep ache post arm, deep ache in elbow muscles   Third and fourth fingers are tingly numb  Pain notes back and severe '  halluc with percocet and   Hives with hydrocodone  Review of Systems No headache, no major weight loss or weight gain, no chest pain no back pain abdominal pain no change in bowel habits complete ROS otherwise negative     Objective:   Physical Exam  Alert and oriented, vitals reviewed and stable, NAD ENT-TM's and ext canals WNL bilat via otoscopic exam Soft palate, tonsils and post pharynx WNL via oropharyngeal exam Neck-symmetric, no masses; thyroid nonpalpable and nontender  Neck is supple without noticeable pain other than complete rotation to the right.  Shoulder good range of motion.  No evidence of true impingement sign.  No discrete tenderness no abnormality.  Question patient points to an area immediately superior to the scapula immediately posterior to the humerus along the distribution of the trapezius extending is a deep ache all the way down to her hand involving the middle fingers.  Sensory exam reveals somewhat diminished sensation or tingling sensation in this area.  Grip intact   Pulmonary-no tachypnea or accessory muscle use; Clear without wheezes via auscultation Card--no abnrml murmurs, rhythm reg and rate WNL Carotid pulses symmetric, without bruits       Assessment & Plan:  Impression classic C7 distribution of cervical neuropathy.  Discussed at length.  Cervical x-rays.  Then cervical MRI.  May need either neurosurgery or injections.  Rationale discussed.  Steroids did not help  the patient does not want narcotics discussed  Greater than 50% of this 25 minute face to face visit was spent in counseling and discussion and coordination of care regarding the above diagnosis/diagnosies

## 2018-04-13 ENCOUNTER — Ambulatory Visit: Payer: BLUE CROSS/BLUE SHIELD | Admitting: Family Medicine

## 2018-04-13 ENCOUNTER — Encounter

## 2018-04-16 ENCOUNTER — Encounter: Payer: Self-pay | Admitting: Family Medicine

## 2018-04-16 MED ORDER — FLUCONAZOLE 150 MG PO TABS: 150.0000 mg | ORAL_TABLET | Freq: Once | ORAL | 0 refills | Status: AC

## 2018-04-16 MED ORDER — TIZANIDINE HCL 2 MG PO CAPS: ORAL_CAPSULE | ORAL | 1 refills | Status: DC

## 2018-04-18 ENCOUNTER — Ambulatory Visit (HOSPITAL_COMMUNITY)
Admission: RE | Admit: 2018-04-18 | Discharge: 2018-04-18 | Disposition: A | Discharge status: Home / Self Care | Payer: BLUE CROSS/BLUE SHIELD | Source: Ambulatory Visit | Attending: Family Medicine | Admitting: Family Medicine

## 2018-04-18 ENCOUNTER — Ambulatory Visit: Payer: BLUE CROSS/BLUE SHIELD | Admitting: Family Medicine

## 2018-04-18 DIAGNOSIS — M4802 Spinal stenosis, cervical region: Secondary | ICD-10-CM | POA: Insufficient documentation

## 2018-04-18 DIAGNOSIS — G542 Cervical root disorders, not elsewhere classified: Secondary | ICD-10-CM | POA: Diagnosis present

## 2018-04-18 DIAGNOSIS — M50221 Other cervical disc displacement at C4-C5 level: Secondary | ICD-10-CM | POA: Insufficient documentation

## 2018-04-20 ENCOUNTER — Other Ambulatory Visit: Payer: Self-pay | Admitting: Family Medicine

## 2018-04-20 NOTE — Addendum Note (Signed)
Addended by: Meredith LeedsSUTTON, CRYSTAL L on: 04/20/2018 08:20 AM   Modules accepted: Orders

## 2018-04-26 ENCOUNTER — Other Ambulatory Visit: Payer: Self-pay | Admitting: Neurological Surgery

## 2018-04-30 ENCOUNTER — Other Ambulatory Visit: Payer: Self-pay

## 2018-04-30 ENCOUNTER — Encounter (HOSPITAL_COMMUNITY): Payer: Self-pay | Admitting: *Deleted

## 2018-04-30 NOTE — Anesthesia Preprocedure Evaluation (Addendum)
Anesthesia Evaluation  Patient identified by MRN, date of birth, ID band Patient awake    Reviewed: Allergy & Precautions, NPO status , Patient's Chart, lab work & pertinent test results  Airway Mallampati: II  TM Distance: >3 FB Neck ROM: Limited   Comment: Limited lateral flexion and rotation. Good flexion and extension. Dental  (+) Teeth Intact, Dental Advisory Given   Pulmonary asthma , former smoker,    Pulmonary exam normal breath sounds clear to auscultation       Cardiovascular hypertension, Normal cardiovascular exam Rhythm:Regular Rate:Normal  Hx/o Gestational HTN   Neuro/Psych  Headaches, PSYCHIATRIC DISORDERS Anxiety Depression OCD PMDDCervical radiculopathy    GI/Hepatic Neg liver ROS, GERD  Medicated and Controlled,IBS   Endo/Other  diabetes, Gestational  Renal/GU negative Renal ROS  negative genitourinary   Musculoskeletal DDD cervical spine HNP C6-7 Right neck and shoulder pain   Abdominal   Peds  Hematology negative hematology ROS (+)   Anesthesia Other Findings   Reproductive/Obstetrics                            Anesthesia Physical Anesthesia Plan  ASA: II  Anesthesia Plan: General   Post-op Pain Management:    Induction: Intravenous  PONV Risk Score and Plan: 4 or greater and Scopolamine patch - Pre-op, Ondansetron, Dexamethasone, Treatment may vary due to age or medical condition and Midazolam  Airway Management Planned: Oral ETT  Additional Equipment:   Intra-op Plan:   Post-operative Plan: Extubation in OR  Informed Consent: I have reviewed the patients History and Physical, chart, labs and discussed the procedure including the risks, benefits and alternatives for the proposed anesthesia with the patient or authorized representative who has indicated his/her understanding and acceptance.   Dental advisory given  Plan Discussed with: CRNA and  Surgeon  Anesthesia Plan Comments:        Anesthesia Quick Evaluation

## 2018-04-30 NOTE — Progress Notes (Signed)
Spoke with pt for pre-op call. Pt denies cardiac history, HTN or diabetes.  

## 2018-05-01 ENCOUNTER — Inpatient Hospital Stay (HOSPITAL_COMMUNITY)
Admission: AD | Admit: 2018-05-01 | Discharge: 2018-05-03 | DRG: 472 | Disposition: A | Discharge status: Home / Self Care | Payer: BLUE CROSS/BLUE SHIELD | Source: Ambulatory Visit | Attending: Neurological Surgery | Admitting: Neurological Surgery

## 2018-05-01 ENCOUNTER — Other Ambulatory Visit: Payer: Self-pay

## 2018-05-01 ENCOUNTER — Ambulatory Visit (HOSPITAL_COMMUNITY): Payer: BLUE CROSS/BLUE SHIELD | Admitting: Anesthesiology

## 2018-05-01 ENCOUNTER — Encounter (HOSPITAL_COMMUNITY)
Admission: AD | Disposition: A | Discharge status: Home / Self Care | Payer: Self-pay | Source: Ambulatory Visit | Attending: Neurological Surgery

## 2018-05-01 ENCOUNTER — Observation Stay (HOSPITAL_COMMUNITY): Payer: BLUE CROSS/BLUE SHIELD | Admitting: Certified Registered"

## 2018-05-01 ENCOUNTER — Encounter (HOSPITAL_COMMUNITY): Payer: Self-pay

## 2018-05-01 ENCOUNTER — Ambulatory Visit (HOSPITAL_COMMUNITY): Payer: BLUE CROSS/BLUE SHIELD

## 2018-05-01 DIAGNOSIS — F429 Obsessive-compulsive disorder, unspecified: Secondary | ICD-10-CM | POA: Diagnosis present

## 2018-05-01 DIAGNOSIS — Y838 Other surgical procedures as the cause of abnormal reaction of the patient, or of later complication, without mention of misadventure at the time of the procedure: Secondary | ICD-10-CM | POA: Diagnosis not present

## 2018-05-01 DIAGNOSIS — F329 Major depressive disorder, single episode, unspecified: Secondary | ICD-10-CM | POA: Diagnosis present

## 2018-05-01 DIAGNOSIS — Z87891 Personal history of nicotine dependence: Secondary | ICD-10-CM | POA: Diagnosis not present

## 2018-05-01 DIAGNOSIS — Y92234 Operating room of hospital as the place of occurrence of the external cause: Secondary | ICD-10-CM | POA: Diagnosis not present

## 2018-05-01 DIAGNOSIS — F419 Anxiety disorder, unspecified: Secondary | ICD-10-CM | POA: Diagnosis present

## 2018-05-01 DIAGNOSIS — G9761 Postprocedural hematoma of a nervous system organ or structure following a nervous system procedure: Secondary | ICD-10-CM | POA: Diagnosis present

## 2018-05-01 DIAGNOSIS — M50123 Cervical disc disorder at C6-C7 level with radiculopathy: Secondary | ICD-10-CM | POA: Diagnosis present

## 2018-05-01 DIAGNOSIS — J45909 Unspecified asthma, uncomplicated: Secondary | ICD-10-CM | POA: Diagnosis present

## 2018-05-01 DIAGNOSIS — K219 Gastro-esophageal reflux disease without esophagitis: Secondary | ICD-10-CM | POA: Diagnosis present

## 2018-05-01 DIAGNOSIS — Z419 Encounter for procedure for purposes other than remedying health state, unspecified: Secondary | ICD-10-CM

## 2018-05-01 DIAGNOSIS — M5412 Radiculopathy, cervical region: Secondary | ICD-10-CM | POA: Diagnosis present

## 2018-05-01 HISTORY — DX: Exercise induced bronchospasm: J45.990

## 2018-05-01 HISTORY — DX: Migraine, unspecified, not intractable, without status migrainosus: G43.909

## 2018-05-01 HISTORY — DX: Other specified postprocedural states: Z98.890

## 2018-05-01 HISTORY — DX: Gastro-esophageal reflux disease without esophagitis: K21.9

## 2018-05-01 HISTORY — DX: Nausea with vomiting, unspecified: R11.2

## 2018-05-01 HISTORY — DX: Irritable bowel syndrome, unspecified: K58.9

## 2018-05-01 HISTORY — PX: EVACUATION OF CERVICAL HEMATOMA: SHX6695

## 2018-05-01 HISTORY — PX: ANTERIOR CERVICAL DECOMP/DISCECTOMY FUSION: SHX1161

## 2018-05-01 LAB — BASIC METABOLIC PANEL
Anion gap: 7 (ref 5–15)
BUN: 9 mg/dL (ref 6–20)
CHLORIDE: 108 mmol/L (ref 98–111)
CO2: 24 mmol/L (ref 22–32)
Calcium: 8.9 mg/dL (ref 8.9–10.3)
Creatinine, Ser: 0.97 mg/dL (ref 0.44–1.00)
GFR calc Af Amer: >60 mL/min (ref >60)
GLUCOSE: 95 mg/dL (ref 70–99)
POTASSIUM: 3.9 mmol/L (ref 3.5–5.1)
Sodium: 139 mmol/L (ref 135–145)

## 2018-05-01 LAB — TYPE AND SCREEN
ABO/RH(D): A POS
ANTIBODY SCREEN: NEGATIVE

## 2018-05-01 LAB — CBC
HEMATOCRIT: 43.5 % (ref 36.0–46.0)
HEMOGLOBIN: 13.5 g/dL (ref 12.0–15.0)
MCH: 28.1 pg (ref 26.0–34.0)
MCHC: 31 g/dL (ref 30.0–36.0)
MCV: 90.4 fL (ref 80.0–100.0)
Platelets: 299 10*3/uL (ref 150–400)
RBC: 4.81 MIL/uL (ref 3.87–5.11)
RDW: 12.4 % (ref 11.5–15.5)
WBC: 6.8 10*3/uL (ref 4.0–10.5)
nRBC: 0 % (ref 0.0–0.2)

## 2018-05-01 LAB — POCT PREGNANCY, URINE: PREG TEST UR: NEGATIVE

## 2018-05-01 LAB — ABO/RH: ABO/RH(D): A POS

## 2018-05-01 LAB — MRSA PCR SCREENING: MRSA BY PCR: NEGATIVE

## 2018-05-01 SURGERY — EVACUATION OF CERVICAL HEMATOMA
Anesthesia: General | Site: Neck

## 2018-05-01 SURGERY — ANTERIOR CERVICAL DECOMPRESSION/DISCECTOMY FUSION 1 LEVEL
Anesthesia: General | Site: Spine Cervical

## 2018-05-01 MED ORDER — SODIUM CHLORIDE 0.9% FLUSH: 3.0000 mL | INTRAVENOUS | Status: DC | PRN

## 2018-05-01 MED ORDER — ACETAMINOPHEN 10 MG/ML IV SOLN: 1000.0000 mg | Freq: Once | INTRAVENOUS | Status: DC | PRN

## 2018-05-01 MED ORDER — MENTHOL 3 MG MT LOZG: 1.0000 | LOZENGE | OROMUCOSAL | Status: DC | PRN

## 2018-05-01 MED ORDER — SODIUM CHLORIDE 0.9 % IV SOLN
INTRAVENOUS | Status: DC | PRN
  Administered 2018-05-01: 17:00:00

## 2018-05-01 MED ORDER — THROMBIN 5000 UNITS EX SOLR
CUTANEOUS | Status: AC
  Filled 2018-05-01: qty 5000

## 2018-05-01 MED ORDER — CYCLOBENZAPRINE HCL 10 MG PO TABS
10.0000 mg | ORAL_TABLET | Freq: Three times a day (TID) | ORAL | Status: DC | PRN
  Administered 2018-05-01: 10 mg via ORAL

## 2018-05-01 MED ORDER — ONDANSETRON HCL 4 MG/2ML IJ SOLN
4.0000 mg | Freq: Four times a day (QID) | INTRAMUSCULAR | Status: DC | PRN
  Administered 2018-05-02 (×3): 4 mg via INTRAVENOUS
  Filled 2018-05-01 (×3): qty 2

## 2018-05-01 MED ORDER — SUCCINYLCHOLINE CHLORIDE 200 MG/10ML IV SOSY
PREFILLED_SYRINGE | INTRAVENOUS | Status: AC
  Filled 2018-05-01: qty 10

## 2018-05-01 MED ORDER — PROPOFOL 1000 MG/100ML IV EMUL
INTRAVENOUS | Status: AC
  Filled 2018-05-01: qty 100

## 2018-05-01 MED ORDER — DEXAMETHASONE SODIUM PHOSPHATE 10 MG/ML IJ SOLN
INTRAMUSCULAR | Status: AC
  Filled 2018-05-01: qty 1

## 2018-05-01 MED ORDER — METOCLOPRAMIDE HCL 5 MG/ML IJ SOLN: 10.0000 mg | Freq: Once | INTRAMUSCULAR | Status: DC | PRN

## 2018-05-01 MED ORDER — THROMBIN 5000 UNITS EX SOLR
OROMUCOSAL | Status: DC | PRN
  Administered 2018-05-01: 5 mL via TOPICAL

## 2018-05-01 MED ORDER — CHLORHEXIDINE GLUCONATE CLOTH 2 % EX PADS: 6.0000 | MEDICATED_PAD | Freq: Once | CUTANEOUS | Status: DC

## 2018-05-01 MED ORDER — FENTANYL CITRATE (PF) 100 MCG/2ML IJ SOLN
INTRAMUSCULAR | Status: AC
  Administered 2018-05-01: 50 ug via INTRAVENOUS
  Filled 2018-05-01: qty 2

## 2018-05-01 MED ORDER — SERTRALINE HCL 50 MG PO TABS
150.0000 mg | ORAL_TABLET | Freq: Every day | ORAL | Status: DC
  Administered 2018-05-02 – 2018-05-03 (×2): 150 mg via ORAL
  Filled 2018-05-01 (×2): qty 1

## 2018-05-01 MED ORDER — ONDANSETRON HCL 4 MG/2ML IJ SOLN
INTRAMUSCULAR | Status: AC
  Filled 2018-05-01: qty 2

## 2018-05-01 MED ORDER — PROMETHAZINE HCL 25 MG/ML IJ SOLN: 6.2500 mg | INTRAMUSCULAR | Status: DC | PRN

## 2018-05-01 MED ORDER — MIDAZOLAM HCL 2 MG/2ML IJ SOLN
INTRAMUSCULAR | Status: AC
  Filled 2018-05-01: qty 2

## 2018-05-01 MED ORDER — ACETAMINOPHEN 500 MG PO TABS: 1000.0000 mg | ORAL_TABLET | Freq: Once | ORAL | Status: DC | PRN

## 2018-05-01 MED ORDER — LIDOCAINE 2% (20 MG/ML) 5 ML SYRINGE
INTRAMUSCULAR | Status: DC | PRN
  Administered 2018-05-01: 80 mg via INTRAVENOUS

## 2018-05-01 MED ORDER — PROPOFOL 500 MG/50ML IV EMUL
INTRAVENOUS | Status: DC | PRN
  Administered 2018-05-01: 25 ug/kg/min via INTRAVENOUS

## 2018-05-01 MED ORDER — PHENOL 1.4 % MT LIQD: 1.0000 | OROMUCOSAL | Status: DC | PRN

## 2018-05-01 MED ORDER — VANCOMYCIN HCL 1000 MG IV SOLR
INTRAVENOUS | Status: DC | PRN
  Administered 2018-05-01: 1000 mg via INTRAVENOUS

## 2018-05-01 MED ORDER — DEXAMETHASONE SODIUM PHOSPHATE 10 MG/ML IJ SOLN
INTRAMUSCULAR | Status: DC | PRN
  Administered 2018-05-01: 10 mg via INTRAVENOUS

## 2018-05-01 MED ORDER — SCOPOLAMINE 1 MG/3DAYS TD PT72
1.0000 | MEDICATED_PATCH | TRANSDERMAL | Status: DC
  Administered 2018-05-01: 1.5 mg via TRANSDERMAL

## 2018-05-01 MED ORDER — HYDROMORPHONE HCL 1 MG/ML IJ SOLN
INTRAMUSCULAR | Status: AC
  Filled 2018-05-01: qty 1

## 2018-05-01 MED ORDER — FENTANYL CITRATE (PF) 250 MCG/5ML IJ SOLN
INTRAMUSCULAR | Status: AC
  Filled 2018-05-01: qty 5

## 2018-05-01 MED ORDER — ONDANSETRON HCL 4 MG/2ML IJ SOLN
INTRAMUSCULAR | Status: DC | PRN
  Administered 2018-05-01 (×2): 4 mg via INTRAVENOUS

## 2018-05-01 MED ORDER — FENTANYL CITRATE (PF) 100 MCG/2ML IJ SOLN
INTRAMUSCULAR | Status: AC
  Administered 2018-05-01: 50 ug
  Filled 2018-05-01: qty 2

## 2018-05-01 MED ORDER — OLOPATADINE HCL 0.1 % OP SOLN
1.0000 [drp] | Freq: Two times a day (BID) | OPHTHALMIC | Status: DC | PRN
  Filled 2018-05-01: qty 5

## 2018-05-01 MED ORDER — ROCURONIUM BROMIDE 10 MG/ML (PF) SYRINGE
PREFILLED_SYRINGE | INTRAVENOUS | Status: DC | PRN
  Administered 2018-05-01 (×4): 20 mg via INTRAVENOUS
  Administered 2018-05-01: 50 mg via INTRAVENOUS

## 2018-05-01 MED ORDER — LACTATED RINGERS IV SOLN
INTRAVENOUS | Status: DC
  Administered 2018-05-01: 07:00:00 via INTRAVENOUS

## 2018-05-01 MED ORDER — LIDOCAINE 2% (20 MG/ML) 5 ML SYRINGE
INTRAMUSCULAR | Status: DC | PRN
  Administered 2018-05-01: 60 mg via INTRAVENOUS

## 2018-05-01 MED ORDER — ACETAMINOPHEN 650 MG RE SUPP: 650.0000 mg | RECTAL | Status: DC | PRN

## 2018-05-01 MED ORDER — PHENTERMINE HCL 37.5 MG PO TABS: 37.5000 mg | ORAL_TABLET | Freq: Every day | ORAL | Status: DC | PRN

## 2018-05-01 MED ORDER — CEFAZOLIN SODIUM 1 G IJ SOLR
INTRAMUSCULAR | Status: AC
  Filled 2018-05-01: qty 20

## 2018-05-01 MED ORDER — DIPHENHYDRAMINE HCL 50 MG/ML IJ SOLN
INTRAMUSCULAR | Status: DC | PRN
  Administered 2018-05-01: 6.25 mg via INTRAVENOUS

## 2018-05-01 MED ORDER — FENTANYL CITRATE (PF) 100 MCG/2ML IJ SOLN
100.0000 ug | Freq: Once | INTRAMUSCULAR | Status: AC
  Administered 2018-05-01: 50 ug via INTRAVENOUS

## 2018-05-01 MED ORDER — ONDANSETRON HCL 4 MG PO TABS
4.0000 mg | ORAL_TABLET | Freq: Four times a day (QID) | ORAL | Status: DC | PRN
  Administered 2018-05-02: 4 mg via ORAL
  Filled 2018-05-01: qty 1

## 2018-05-01 MED ORDER — PROPOFOL 10 MG/ML IV BOLUS
INTRAVENOUS | Status: DC | PRN
  Administered 2018-05-01: 130 mg via INTRAVENOUS
  Administered 2018-05-01: 20 mg via INTRAVENOUS
  Administered 2018-05-01: 30 mg via INTRAVENOUS

## 2018-05-01 MED ORDER — SODIUM CHLORIDE 0.9 % IV SOLN
INTRAVENOUS | Status: DC | PRN
  Administered 2018-05-01: 500 mL

## 2018-05-01 MED ORDER — 0.9 % SODIUM CHLORIDE (POUR BTL) OPTIME
TOPICAL | Status: DC | PRN
  Administered 2018-05-01: 1000 mL

## 2018-05-01 MED ORDER — FENTANYL CITRATE (PF) 100 MCG/2ML IJ SOLN
INTRAMUSCULAR | Status: AC
  Filled 2018-05-01: qty 2

## 2018-05-01 MED ORDER — LIDOCAINE 2% (20 MG/ML) 5 ML SYRINGE
INTRAMUSCULAR | Status: AC
  Filled 2018-05-01: qty 5

## 2018-05-01 MED ORDER — HYDROMORPHONE HCL 1 MG/ML IJ SOLN
0.2500 mg | INTRAMUSCULAR | Status: DC | PRN
  Administered 2018-05-01 (×2): 0.5 mg via INTRAVENOUS

## 2018-05-01 MED ORDER — PANTOPRAZOLE SODIUM 40 MG PO TBEC
40.0000 mg | DELAYED_RELEASE_TABLET | Freq: Every day | ORAL | Status: DC
  Administered 2018-05-02 – 2018-05-03 (×2): 40 mg via ORAL
  Filled 2018-05-01 (×2): qty 1

## 2018-05-01 MED ORDER — MUSCLE RUB 10-15 % EX CREA
1.0000 "application " | TOPICAL_CREAM | Freq: Every day | CUTANEOUS | Status: DC | PRN
  Filled 2018-05-01: qty 85

## 2018-05-01 MED ORDER — SUCCINYLCHOLINE CHLORIDE 20 MG/ML IJ SOLN
INTRAMUSCULAR | Status: DC | PRN
  Administered 2018-05-01: 60 mg via INTRAVENOUS

## 2018-05-01 MED ORDER — LORATADINE 10 MG PO TABS: 5.0000 mg | ORAL_TABLET | Freq: Every day | ORAL | Status: DC | PRN

## 2018-05-01 MED ORDER — FENTANYL CITRATE (PF) 100 MCG/2ML IJ SOLN
50.0000 ug | INTRAMUSCULAR | Status: AC | PRN
  Administered 2018-05-01: 100 ug via INTRAVENOUS
  Administered 2018-05-01: 50 ug via INTRAVENOUS
  Filled 2018-05-01: qty 1

## 2018-05-01 MED ORDER — ACETAMINOPHEN 325 MG PO TABS: 650.0000 mg | ORAL_TABLET | ORAL | Status: DC | PRN

## 2018-05-01 MED ORDER — THROMBIN 5000 UNITS EX SOLR
OROMUCOSAL | Status: DC | PRN
  Administered 2018-05-01: 17:00:00 via TOPICAL

## 2018-05-01 MED ORDER — ACETAMINOPHEN 160 MG/5ML PO SOLN: 1000.0000 mg | Freq: Once | ORAL | Status: DC | PRN

## 2018-05-01 MED ORDER — HYDROMORPHONE HCL 1 MG/ML IJ SOLN
0.5000 mg | INTRAMUSCULAR | Status: DC | PRN
  Administered 2018-05-01 – 2018-05-03 (×8): 0.5 mg via INTRAVENOUS
  Filled 2018-05-01 (×8): qty 1

## 2018-05-01 MED ORDER — OXYCODONE HCL 5 MG PO TABS
10.0000 mg | ORAL_TABLET | ORAL | Status: DC | PRN
  Filled 2018-05-01: qty 2

## 2018-05-01 MED ORDER — ONDANSETRON HCL 4 MG/2ML IJ SOLN
INTRAMUSCULAR | Status: DC | PRN
  Administered 2018-05-01: 4 mg via INTRAVENOUS

## 2018-05-01 MED ORDER — MEPERIDINE HCL 50 MG/ML IJ SOLN: 6.2500 mg | INTRAMUSCULAR | Status: DC | PRN

## 2018-05-01 MED ORDER — MONTELUKAST SODIUM 10 MG PO TABS
10.0000 mg | ORAL_TABLET | Freq: Every day | ORAL | Status: DC
  Administered 2018-05-01 – 2018-05-02 (×2): 10 mg via ORAL
  Filled 2018-05-01 (×2): qty 1

## 2018-05-01 MED ORDER — SODIUM CHLORIDE 0.9% FLUSH
3.0000 mL | Freq: Two times a day (BID) | INTRAVENOUS | Status: DC
  Administered 2018-05-01 – 2018-05-02 (×3): 3 mL via INTRAVENOUS

## 2018-05-01 MED ORDER — LIDOCAINE-EPINEPHRINE 1 %-1:100000 IJ SOLN
INTRAMUSCULAR | Status: AC
  Filled 2018-05-01: qty 1

## 2018-05-01 MED ORDER — SCOPOLAMINE 1 MG/3DAYS TD PT72
MEDICATED_PATCH | TRANSDERMAL | Status: AC
  Administered 2018-05-01: 1.5 mg via TRANSDERMAL
  Filled 2018-05-01: qty 1

## 2018-05-01 MED ORDER — SUGAMMADEX SODIUM 200 MG/2ML IV SOLN
INTRAVENOUS | Status: DC | PRN
  Administered 2018-05-01: 200 mg via INTRAVENOUS

## 2018-05-01 MED ORDER — OXYCODONE HCL 5 MG PO TABS: 5.0000 mg | ORAL_TABLET | ORAL | Status: DC | PRN

## 2018-05-01 MED ORDER — PROPOFOL 10 MG/ML IV BOLUS
INTRAVENOUS | Status: AC
  Filled 2018-05-01: qty 20

## 2018-05-01 MED ORDER — PROPOFOL 10 MG/ML IV BOLUS
INTRAVENOUS | Status: DC | PRN
  Administered 2018-05-01: 175 mg via INTRAVENOUS

## 2018-05-01 MED ORDER — MIDAZOLAM HCL 2 MG/2ML IJ SOLN
INTRAMUSCULAR | Status: DC | PRN
  Administered 2018-05-01: 2 mg via INTRAVENOUS

## 2018-05-01 MED ORDER — DIPHENHYDRAMINE HCL 50 MG/ML IJ SOLN
INTRAMUSCULAR | Status: AC
  Filled 2018-05-01: qty 1

## 2018-05-01 MED ORDER — PHENYLEPHRINE 40 MCG/ML (10ML) SYRINGE FOR IV PUSH (FOR BLOOD PRESSURE SUPPORT)
PREFILLED_SYRINGE | INTRAVENOUS | Status: AC
  Filled 2018-05-01: qty 10

## 2018-05-01 MED ORDER — CYCLOBENZAPRINE HCL 10 MG PO TABS
ORAL_TABLET | ORAL | Status: AC
  Filled 2018-05-01: qty 1

## 2018-05-01 MED ORDER — FENTANYL CITRATE (PF) 250 MCG/5ML IJ SOLN
INTRAMUSCULAR | Status: DC | PRN
  Administered 2018-05-01: 50 ug via INTRAVENOUS
  Administered 2018-05-01: 100 ug via INTRAVENOUS

## 2018-05-01 MED ORDER — MENTHOL 3 MG MT LOZG
LOZENGE | OROMUCOSAL | Status: AC
  Filled 2018-05-01: qty 9

## 2018-05-01 MED ORDER — LIDOCAINE-EPINEPHRINE 1 %-1:100000 IJ SOLN
INTRAMUSCULAR | Status: DC | PRN
  Administered 2018-05-01: 9 mL

## 2018-05-01 MED ORDER — SODIUM CHLORIDE 0.9 % IV SOLN: 250.0000 mL | INTRAVENOUS | Status: DC

## 2018-05-01 MED ORDER — VANCOMYCIN HCL IN DEXTROSE 1-5 GM/200ML-% IV SOLN
INTRAVENOUS | Status: AC
  Filled 2018-05-01: qty 200

## 2018-05-01 MED ORDER — ROCURONIUM BROMIDE 50 MG/5ML IV SOSY
PREFILLED_SYRINGE | INTRAVENOUS | Status: AC
  Filled 2018-05-01: qty 5

## 2018-05-01 MED ORDER — CEFAZOLIN SODIUM-DEXTROSE 1-4 GM/50ML-% IV SOLN
INTRAVENOUS | Status: AC
  Filled 2018-05-01: qty 50

## 2018-05-01 MED ORDER — FENTANYL CITRATE (PF) 100 MCG/2ML IJ SOLN: 25.0000 ug | INTRAMUSCULAR | Status: DC | PRN

## 2018-05-01 MED ORDER — VANCOMYCIN HCL IN DEXTROSE 1-5 GM/200ML-% IV SOLN
1000.0000 mg | INTRAVENOUS | Status: AC
  Administered 2018-05-01: 1000 mg via INTRAVENOUS
  Filled 2018-05-01: qty 200

## 2018-05-01 SURGICAL SUPPLY — 59 items
BAG DECANTER FOR FLEXI CONT (MISCELLANEOUS) ×3 IMPLANT
BENZOIN TINCTURE PRP APPL 2/3 (GAUZE/BANDAGES/DRESSINGS) IMPLANT
BLADE CLIPPER SURG (BLADE) IMPLANT
BLADE SURG 11 STRL SS (BLADE) ×3 IMPLANT
BUR MATCHSTICK NEURO 3.0 LAGG (BURR) ×3 IMPLANT
CANISTER SUCT 3000ML PPV (MISCELLANEOUS) ×3 IMPLANT
COVER WAND RF STERILE (DRAPES) IMPLANT
DECANTER SPIKE VIAL GLASS SM (MISCELLANEOUS) ×3 IMPLANT
DERMABOND ADVANCED (GAUZE/BANDAGES/DRESSINGS) ×2
DERMABOND ADVANCED .7 DNX12 (GAUZE/BANDAGES/DRESSINGS) ×1 IMPLANT
DRAPE C-ARM 42X72 X-RAY (DRAPES) ×6 IMPLANT
DRAPE HALF SHEET 40X57 (DRAPES) IMPLANT
DRAPE LAPAROTOMY 100X72 PEDS (DRAPES) ×3 IMPLANT
DRAPE MICROSCOPE LEICA (MISCELLANEOUS) ×3 IMPLANT
DRSG OPSITE POSTOP 4X6 (GAUZE/BANDAGES/DRESSINGS) ×3 IMPLANT
DURAPREP 6ML APPLICATOR 50/CS (WOUND CARE) ×3 IMPLANT
ELECT COATED BLADE 2.86 ST (ELECTRODE) ×3 IMPLANT
ELECT REM PT RETURN 9FT ADLT (ELECTROSURGICAL) ×3
ELECTRODE REM PT RTRN 9FT ADLT (ELECTROSURGICAL) ×1 IMPLANT
GAUZE 4X4 16PLY RFD (DISPOSABLE) IMPLANT
GLOVE BIO SURGEON STRL SZ7.5 (GLOVE) IMPLANT
GLOVE BIOGEL PI IND STRL 6.5 (GLOVE) ×2 IMPLANT
GLOVE BIOGEL PI IND STRL 7.0 (GLOVE) ×2 IMPLANT
GLOVE BIOGEL PI IND STRL 7.5 (GLOVE) ×3 IMPLANT
GLOVE BIOGEL PI INDICATOR 6.5 (GLOVE) ×4
GLOVE BIOGEL PI INDICATOR 7.0 (GLOVE) ×4
GLOVE BIOGEL PI INDICATOR 7.5 (GLOVE) ×6
GLOVE ECLIPSE 6.5 STRL STRAW (GLOVE) ×3 IMPLANT
GLOVE EXAM NITRILE LRG STRL (GLOVE) IMPLANT
GLOVE EXAM NITRILE XL STR (GLOVE) IMPLANT
GLOVE EXAM NITRILE XS STR PU (GLOVE) IMPLANT
GLOVE SURG SS PI 6.0 STRL IVOR (GLOVE) ×3 IMPLANT
GOWN STRL REUS W/ TWL LRG LVL3 (GOWN DISPOSABLE) ×3 IMPLANT
GOWN STRL REUS W/ TWL XL LVL3 (GOWN DISPOSABLE) ×2 IMPLANT
GOWN STRL REUS W/TWL 2XL LVL3 (GOWN DISPOSABLE) IMPLANT
GOWN STRL REUS W/TWL LRG LVL3 (GOWN DISPOSABLE) ×6
GOWN STRL REUS W/TWL XL LVL3 (GOWN DISPOSABLE) ×4
HEMOSTAT POWDER KIT SURGIFOAM (HEMOSTASIS) ×3 IMPLANT
KIT BASIN OR (CUSTOM PROCEDURE TRAY) ×3 IMPLANT
KIT TURNOVER KIT B (KITS) ×3 IMPLANT
NEEDLE HYPO 22GX1.5 SAFETY (NEEDLE) ×3 IMPLANT
NEEDLE SPNL 18GX3.5 QUINCKE PK (NEEDLE) ×3 IMPLANT
NS IRRIG 1000ML POUR BTL (IV SOLUTION) ×3 IMPLANT
PACK LAMINECTOMY NEURO (CUSTOM PROCEDURE TRAY) ×3 IMPLANT
PAD ARMBOARD 7.5X6 YLW CONV (MISCELLANEOUS) ×9 IMPLANT
PIN DISTRACTION 14MM (PIN) ×6 IMPLANT
PLATE ANT CERV ATLANTIS 19 (Plate) ×3 IMPLANT
RUBBERBAND STERILE (MISCELLANEOUS) ×6 IMPLANT
SCREW SELF TAP VAR 4.0X13 (Screw) ×12 IMPLANT
SPACER BONE CORNERSTONE 6X14 (Orthopedic Implant) ×3 IMPLANT
SPONGE INTESTINAL PEANUT (DISPOSABLE) ×6 IMPLANT
SPONGE SURGIFOAM ABS GEL SZ50 (HEMOSTASIS) IMPLANT
STAPLER VISISTAT 35W (STAPLE) IMPLANT
SUT MNCRL AB 3-0 PS2 18 (SUTURE) ×3 IMPLANT
SUT VIC AB 3-0 SH 8-18 (SUTURE) ×6 IMPLANT
TAPE CLOTH 3X10 TAN LF (GAUZE/BANDAGES/DRESSINGS) ×3 IMPLANT
TOWEL GREEN STERILE (TOWEL DISPOSABLE) ×3 IMPLANT
TOWEL GREEN STERILE FF (TOWEL DISPOSABLE) ×3 IMPLANT
WATER STERILE IRR 1000ML POUR (IV SOLUTION) ×3 IMPLANT

## 2018-05-01 SURGICAL SUPPLY — 58 items
BAG DECANTER FOR FLEXI CONT (MISCELLANEOUS) ×2 IMPLANT
BENZOIN TINCTURE PRP APPL 2/3 (GAUZE/BANDAGES/DRESSINGS) IMPLANT
BLADE CLIPPER SURG (BLADE) IMPLANT
BLADE SURG 11 STRL SS (BLADE) IMPLANT
BUR MATCHSTICK NEURO 3.0 LAGG (BURR) IMPLANT
CANISTER SUCT 3000ML PPV (MISCELLANEOUS) ×2 IMPLANT
COVER WAND RF STERILE (DRAPES) IMPLANT
DECANTER SPIKE VIAL GLASS SM (MISCELLANEOUS) IMPLANT
DERMABOND ADVANCED (GAUZE/BANDAGES/DRESSINGS) ×1
DERMABOND ADVANCED .7 DNX12 (GAUZE/BANDAGES/DRESSINGS) ×1 IMPLANT
DRAIN SNY WOU 7FLT (WOUND CARE) ×2 IMPLANT
DRAPE C-ARM 42X72 X-RAY (DRAPES) IMPLANT
DRAPE HALF SHEET 40X57 (DRAPES) IMPLANT
DRAPE LAPAROTOMY 100X72 PEDS (DRAPES) ×2 IMPLANT
DRAPE MICROSCOPE LEICA (MISCELLANEOUS) IMPLANT
DURAPREP 6ML APPLICATOR 50/CS (WOUND CARE) ×2 IMPLANT
ELECT COATED BLADE 2.86 ST (ELECTRODE) ×2 IMPLANT
ELECT REM PT RETURN 9FT ADLT (ELECTROSURGICAL) ×2
ELECTRODE REM PT RTRN 9FT ADLT (ELECTROSURGICAL) ×1 IMPLANT
EVACUATOR SILICONE 100CC (DRAIN) ×2 IMPLANT
GAUZE 4X4 16PLY RFD (DISPOSABLE) IMPLANT
GLOVE BIO SURGEON STRL SZ7.5 (GLOVE) IMPLANT
GLOVE BIOGEL PI IND STRL 6.5 (GLOVE) ×1 IMPLANT
GLOVE BIOGEL PI IND STRL 7.5 (GLOVE) ×2 IMPLANT
GLOVE BIOGEL PI INDICATOR 6.5 (GLOVE) ×1
GLOVE BIOGEL PI INDICATOR 7.5 (GLOVE) ×2
GLOVE EXAM NITRILE LRG STRL (GLOVE) IMPLANT
GLOVE EXAM NITRILE XL STR (GLOVE) IMPLANT
GLOVE EXAM NITRILE XS STR PU (GLOVE) IMPLANT
GLOVE SURG SS PI 6.0 STRL IVOR (GLOVE) ×6 IMPLANT
GLOVE SURG SS PI 7.0 STRL IVOR (GLOVE) ×2 IMPLANT
GLOVE SURG SS PI 7.5 STRL IVOR (GLOVE) ×2 IMPLANT
GOWN STRL REUS W/ TWL LRG LVL3 (GOWN DISPOSABLE) ×3 IMPLANT
GOWN STRL REUS W/ TWL XL LVL3 (GOWN DISPOSABLE) IMPLANT
GOWN STRL REUS W/TWL 2XL LVL3 (GOWN DISPOSABLE) IMPLANT
GOWN STRL REUS W/TWL LRG LVL3 (GOWN DISPOSABLE) ×3
GOWN STRL REUS W/TWL XL LVL3 (GOWN DISPOSABLE)
HEMOSTAT POWDER KIT SURGIFOAM (HEMOSTASIS) ×2 IMPLANT
KIT BASIN OR (CUSTOM PROCEDURE TRAY) ×2 IMPLANT
KIT TURNOVER KIT B (KITS) ×2 IMPLANT
NEEDLE HYPO 22GX1.5 SAFETY (NEEDLE) IMPLANT
NEEDLE SPNL 18GX3.5 QUINCKE PK (NEEDLE) IMPLANT
NS IRRIG 1000ML POUR BTL (IV SOLUTION) ×2 IMPLANT
PACK LAMINECTOMY NEURO (CUSTOM PROCEDURE TRAY) ×2 IMPLANT
PAD ARMBOARD 7.5X6 YLW CONV (MISCELLANEOUS) ×4 IMPLANT
PIN DISTRACTION 14MM (PIN) IMPLANT
RUBBERBAND STERILE (MISCELLANEOUS) IMPLANT
SPONGE INTESTINAL PEANUT (DISPOSABLE) IMPLANT
SPONGE SURGIFOAM ABS GEL SZ50 (HEMOSTASIS) IMPLANT
STAPLER VISISTAT 35W (STAPLE) IMPLANT
SUT MNCRL AB 3-0 PS2 18 (SUTURE) ×2 IMPLANT
SUT VIC AB 2-0 CT1 27 (SUTURE) ×1
SUT VIC AB 2-0 CT1 TAPERPNT 27 (SUTURE) ×1 IMPLANT
SUT VIC AB 3-0 SH 8-18 (SUTURE) ×4 IMPLANT
TAPE CLOTH 3X10 TAN LF (GAUZE/BANDAGES/DRESSINGS) IMPLANT
TOWEL GREEN STERILE (TOWEL DISPOSABLE) ×2 IMPLANT
TOWEL GREEN STERILE FF (TOWEL DISPOSABLE) ×2 IMPLANT
WATER STERILE IRR 1000ML POUR (IV SOLUTION) ×2 IMPLANT

## 2018-05-01 NOTE — H&P (Signed)
Surgical H&P Update  HPI: 42 y.o. woman with severe RUE radicular pain, here for surgical treatment. She initially presented with severe pain that prompted an MRI of the cervical spine, which showed a large right paracentral disc herniation at C6-7. She did not improve with non-surgical treatment and therefore now presents for surgical treatment. No changes in health since she was last seen. Still having sever pain and wishes to proceed with surgery.  PMHx:  Past Medical History:  Diagnosis Date  . Asthma    in high school  . Depression    for OCD  . GERD (gastroesophageal reflux disease)   . Gestational diabetes   . Headaches due to old head injury   . IBS (irritable bowel syndrome)   . Insomnia   . Nausea   . Urticaria    FamHx:  Family History  Problem Relation Age of Onset  . Allergic rhinitis Mother   . Asthma Brother   . Colon cancer Neg Hx   . Eczema Neg Hx   . Urticaria Neg Hx    SocHx:  reports that she has quit smoking. She has never used smokeless tobacco. She reports that she drinks alcohol. She reports that she does not use drugs.  Physical Exam: AOx3, PERRL, FS, TM  Strength 5/5 x4 except for R triceps 4/5, SILTx4 except R C7 distribution numbness  Assesment/Plan: 42 y.o. woman with C7 radiculopathy, here for C6-7 ACDF. Risks, benefits, and alternatives discussed and the patient would like to continue with surgery. -OR today -3C post-op  Jadene Pierinihomas A Alyna Stensland, MD 05/01/18 7:05 AM

## 2018-05-01 NOTE — Anesthesia Procedure Notes (Signed)
Procedure Name: Intubation Date/Time: 05/01/2018 4:38 PM Performed by: Julian ReilWelty, Angila Wombles F, CRNA Pre-anesthesia Checklist: Emergency Drugs available, Patient identified, Suction available and Patient being monitored Patient Re-evaluated:Patient Re-evaluated prior to induction Oxygen Delivery Method: Circle system utilized Preoxygenation: Pre-oxygenation with 100% oxygen Induction Type: IV induction, Rapid sequence and Cricoid Pressure applied Laryngoscope Size: Glidescope and 4 Grade View: Grade I Tube type: Oral Tube size: 7.0 mm Number of attempts: 1 Airway Equipment and Method: Stylet and Video-laryngoscopy Placement Confirmation: ETT inserted through vocal cords under direct vision,  breath sounds checked- equal and bilateral and positive ETCO2 Secured at: 23 cm Tube secured with: Tape Dental Injury: Teeth and Oropharynx as per pre-operative assessment  Comments: RSI due to N/V and PO intake 100+cc water approx 40 min prior to induction.  Glidescope electively used.  4x4s bite block used after intubation.

## 2018-05-01 NOTE — Anesthesia Preprocedure Evaluation (Signed)
Anesthesia Evaluation  Patient identified by MRN, date of birth, ID band Patient awake    Reviewed: Allergy & Precautions, NPO status , Patient's Chart, lab work & pertinent test results  History of Anesthesia Complications Negative for: history of anesthetic complications  Airway Mallampati: IV  TM Distance: >3 FB Neck ROM: Limited   Comment: Limited lateral flexion and rotation. Good flexion and extension. Dental  (+) Teeth Intact, Dental Advisory Given   Pulmonary asthma , former smoker,    Pulmonary exam normal breath sounds clear to auscultation       Cardiovascular hypertension, Normal cardiovascular exam Rhythm:Regular Rate:Normal  Hx/o Gestational HTN   Neuro/Psych  Headaches, PSYCHIATRIC DISORDERS Anxiety Depression OCD PMDDCervical radiculopathy  Neuromuscular disease    GI/Hepatic Neg liver ROS, GERD  Medicated and Controlled,IBS   Endo/Other  diabetes, Gestational  Renal/GU negative Renal ROS  negative genitourinary   Musculoskeletal DDD cervical spine HNP C6-7 Right neck and shoulder pain   Abdominal   Peds  Hematology negative hematology ROS (+)   Anesthesia Other Findings   Reproductive/Obstetrics                             Anesthesia Physical Anesthesia Plan  ASA: II and emergent  Anesthesia Plan: General   Post-op Pain Management:    Induction: Intravenous and Rapid sequence  PONV Risk Score and Plan: 3 and Ondansetron, Dexamethasone and Propofol infusion  Airway Management Planned: Oral ETT and Video Laryngoscope Planned  Additional Equipment: None  Intra-op Plan:   Post-operative Plan: Extubation in OR  Informed Consent: I have reviewed the patients History and Physical, chart, labs and discussed the procedure including the risks, benefits and alternatives for the proposed anesthesia with the patient or authorized representative who has indicated his/her  understanding and acceptance.   Dental advisory given  Plan Discussed with: CRNA and Surgeon  Anesthesia Plan Comments: (Incisional hematoma posted emergent by surgeon. No current airway comprimise. )        Anesthesia Quick Evaluation

## 2018-05-01 NOTE — Transfer of Care (Signed)
Immediate Anesthesia Transfer of Care Note  Patient: [name]  Procedure(s) Performed: Cervical Six-Seven Anterior cervical decompression/discectomy/fusion (N/A Spine Cervical)  Patient Location: PACU  Anesthesia Type:General  Level of Consciousness: awake, alert , oriented and patient cooperative  Airway & Oxygen Therapy: Patient Spontanous Breathing  Post-op Assessment: Report given to RN, Post -op Vital signs reviewed and stable and Patient moving all extremities X 4  Post vital signs: Reviewed and stable  Last Vitals:  Vitals Value Taken Time  BP 144/71 05/01/2018  1:53 PM  Temp    Pulse 107 05/01/2018  1:57 PM  Resp 17 05/01/2018  1:57 PM  SpO2 95 % 05/01/2018  1:57 PM  Vitals shown include unvalidated device data.  Last Pain:  Vitals:   05/01/18 1020  TempSrc:   PainSc: 3       Patients Stated Pain Goal: 2 (05/01/18 [id number])  Complications: No apparent anesthesia complications

## 2018-05-01 NOTE — Anesthesia Procedure Notes (Signed)
Procedure Name: Intubation Date/Time: 05/01/2018 11:35 AM Performed by: Burt Ekurner, Arul Farabee Ashley, CRNA Pre-anesthesia Checklist: Patient identified, Emergency Drugs available, Suction available and Patient being monitored Patient Re-evaluated:Patient Re-evaluated prior to induction Oxygen Delivery Method: Circle system utilized Preoxygenation: Pre-oxygenation with 100% oxygen Induction Type: IV induction Ventilation: Mask ventilation without difficulty Laryngoscope Size: Glidescope and 3 Grade View: Grade I Tube size: 7.0 mm Number of attempts: 1 Airway Equipment and Method: Rigid stylet Placement Confirmation: ETT inserted through vocal cords under direct vision,  positive ETCO2 and breath sounds checked- equal and bilateral Secured at: 21 cm Tube secured with: Tape Dental Injury: Teeth and Oropharynx as per pre-operative assessment  Comments: Performed by Allene PyoWalt Cockfield CRNA

## 2018-05-01 NOTE — Brief Op Note (Signed)
05/01/2018  1:42 PM  PATIENT:  [name]  42 y.o. female  PRE-OPERATIVE DIAGNOSIS:  Cervical radiculopathy  POST-OPERATIVE DIAGNOSIS:  Cervical Radiculopathy  PROCEDURE:  Procedure(s) with comments: Cervical Six-Seven Anterior cervical decompression/discectomy/fusion (N/A) - anterior  SURGEON:  Surgeon(s) and Role:    * Yoona Ishii, Clovis Puhomas A, MD - Primary    * Lisbeth RenshawNundkumar, Neelesh, MD - Assisting  PHYSICIAN ASSISTANT:   ANESTHESIA:   general  EBL:  30 mL   BLOOD ADMINISTERED:none  DRAINS: none   LOCAL MEDICATIONS USED:  LIDOCAINE   SPECIMEN:  No Specimen  DISPOSITION OF SPECIMEN:  N/A  COUNTS:  YES  TOURNIQUET:  * No tourniquets in log *  DICTATION: .Note written in EPIC  PLAN OF CARE: Admit for overnight observation  PATIENT DISPOSITION:  PACU - hemodynamically stable.   Delay start of Pharmacological VTE agent (>24hrs) due to surgical blood loss or risk of bleeding: yes

## 2018-05-01 NOTE — Transfer of Care (Signed)
Immediate Anesthesia Transfer of Care Note  Patient: Mora ApplCasey C Schrodt  Procedure(s) Performed: EVACUATION OF CERVICAL HEMATOMA (N/A Neck)  Patient Location: PACU  Anesthesia Type:General  Level of Consciousness: sedated and drowsy  Airway & Oxygen Therapy: Patient Spontanous Breathing and Patient connected to nasal cannula oxygen  Post-op Assessment: Report given to RN and Post -op Vital signs reviewed and stable  Post vital signs: Reviewed and stable  Last Vitals:  Vitals Value Taken Time  BP 131/81 05/01/2018  5:41 PM  Temp    Pulse 87 05/01/2018  5:42 PM  Resp 13 05/01/2018  5:42 PM  SpO2 100 % 05/01/2018  5:42 PM  Vitals shown include unvalidated device data.  Last Pain:  Vitals:   05/01/18 1615  TempSrc:   PainSc: Asleep      Patients Stated Pain Goal: 3 (05/01/18 1415)  Complications: No apparent anesthesia complications

## 2018-05-01 NOTE — Brief Op Note (Signed)
05/01/2018  5:32 PM  PATIENT:  Mora Applasey C Flegel  42 y.o. female  PRE-OPERATIVE DIAGNOSIS:  Post-operative anterior cervical hematoma  POST-OPERATIVE DIAGNOSIS:  Post-operative anterior cervical hematoma  PROCEDURE:  Procedure(s): EVACUATION OF CERVICAL HEMATOMA (N/A)  SURGEON:  Surgeon(s) and Role:    * Ostergard, Clovis Puhomas A, MD - Primary    * Lisbeth RenshawNundkumar, Neelesh, MD - Assisting  ANESTHESIA:   general  EBL:  50 mL   BLOOD ADMINISTERED:none  DRAINS: #7 flat drain   LOCAL MEDICATIONS USED:  NONE  SPECIMEN:  No Specimen  DISPOSITION OF SPECIMEN:  N/A  COUNTS:  YES  TOURNIQUET:  * No tourniquets in log *  DICTATION: .Note written in EPIC  PLAN OF CARE: Admit to inpatient   PATIENT DISPOSITION:  PACU - hemodynamically stable.   Delay start of Pharmacological VTE agent (>24hrs) due to surgical blood loss or risk of bleeding: yes

## 2018-05-01 NOTE — Anesthesia Postprocedure Evaluation (Signed)
Anesthesia Post Note  Patient: [name]  Procedure(s) Performed: Cervical Six-Seven Anterior cervical decompression/discectomy/fusion (N/A Spine Cervical)     Patient location during evaluation: PACU Anesthesia Type: General Level of consciousness: awake and alert and oriented Pain management: pain level controlled Vital Signs Assessment: post-procedure vital signs reviewed and stable Respiratory status: spontaneous breathing, nonlabored ventilation, respiratory function stable and patient connected to nasal cannula oxygen Cardiovascular status: blood pressure returned to baseline and stable Postop Assessment: no apparent nausea or vomiting Anesthetic complications: no    Last Vitals:  Vitals:   05/01/18 1353 05/01/18 1405  BP: (!) 144/71 134/81  Pulse:  94  Resp: 14 15  Temp: (!) 36.4 C   SpO2: 97% 96%    Last Pain:  Vitals:   05/01/18 1415  TempSrc:   PainSc: 8                  Recie Cirrincione A.

## 2018-05-01 NOTE — Progress Notes (Signed)
Neurosurgery Service Progress note  Assessment & Plan: Called by pt's RN in PACU, anterior cervical incision has slowly been enlarging over the past 2 hours. Pt hemodynamically stable without any respiratory distress or new discomfort. However, anterior cervical incision is visibly swollen and does not feel as soft as immediately post-op, concerning for anterior cervical post-operative hematoma. I discussed this with the patient and her husband. She should return to the OR for evacuation and identification of the source. -OR for evacuation of anterior cervical hematoma  Jadene Pierinihomas A Kynadee Dam  05/01/18 3:59 PM

## 2018-05-01 NOTE — Op Note (Signed)
PATIENT: Mora ApplCasey C Lienhard  PROCEDURE DATE: 05/01/18  PRE-OPERATIVE DIAGNOSIS:  Cervical radiculopathy   POST-OPERATIVE DIAGNOSIS:  Cervical radiculopathy   PROCEDURE:  C6-C7 Anterior Cervical Discectomy and Instrumented Fusion   SURGEON:  Surgeon(s) and Role:    Jadene Pierinistergard, Selah Klang A, MD - Primary    Lisbeth RenshawNundkumar, Neelesh, MD - Assisting   ANESTHESIA: ETGA   BRIEF HISTORY: This is a 42yo woman who presented with numbness and severe right upper extremity pain in the C7 distribution. An MRI showed a corresponding significant C6-7 disc herniation. Her symptoms did not improve with non-surgical management after an appropriate period of watchful waiting. This was discussed with the patient as well as risks, benefits, and alternatives and the patient wished to proceed with surgical treatment.   OPERATIVE DETAIL: The patient was taken to the operating room and placed on the OR table in the supine position. A formal time out was performed with two patient identifiers and confirmed the operative site. Anesthesia was induced by the anesthesia team.  Fluoroscopy was used to localize the surgical level and an incision was marked in a skin crease. The area was then prepped and draped in a sterile fashion. A transverse linear incision was made on the right side of the neck. The platysma was divided and the sternocleidomastoid muscle was identified. The carotid sheath was palpated, identified, and retracted laterally with the sternocleidomastoid muscle. The strap muscles were identified and retracted medially and the pretracheal fascia was entered. A bent spinal needle was used with fluoroscopy to localize the surgical level after dissection. The longus colli were elevated bilaterally and a self-retaining retractor was placed. The endotracheal tube cuff balloon was deflated and reinflated after retractor placement.   Anterior osteophytes were removed until flush with the anterior vertebral body. The disc annulus was  incised and a complete C6-C7 discectomy was performed. The posterior longitudinal ligament was incised followed by ligamentous and bony removal until no central canal stenosis was present. Decompression was then taken out laterally into the bilateral foramina until no foraminal stenosis was palpable. A 6mm cortical allograft ([employer]) was inserted into the disc space as an interbody graft. An anterior plate ([employer]) was positioned and 4, 13mm screws were used to secure the plate to the C6 and C7 vertebral bodies. Hemostasis was obtained and the incision was closed in layers. All instrument and sponge counts were correct. The patient was then returned to anesthesia for emergence. No apparent complications at the completion of the procedure.   EBL:  50mL   DRAINS: none   SPECIMENS: none   Jadene Pierinihomas A Farren Landa, MD 05/01/18 10:40 AM

## 2018-05-01 NOTE — Op Note (Signed)
PATIENT: [name]  DAY OF SURGERY: 05/01/18   PRE-OPERATIVE DIAGNOSIS:  Anterior cervical post-operative hematoma   POST-OPERATIVE DIAGNOSIS:  Anterior cervical post-operative hematoma   PROCEDURE:  Evacuation of anterior cervical post-operative hematoma   SURGEON:  Surgeon(s) and Role:    Marie Pierinistergard, Rayette Mogg A, MD - Primary    Lisbeth RenshawNundkumar, Neelesh, MD - Assitant   ANESTHESIA: ETGA   BRIEF HISTORY: [name] had an uncomplicated single level ACDF earlier today. While in PACU, she has had increasing swelling underneath her cervical incision, concerning for a post-operative hematoma. She was therefore brought back to the operating room for evacuation of the hematoma and identification of the source.    OPERATIVE DETAIL: The patient was taken to the operating room and placed on the OR table in the supine position. A formal time out was performed with two patient identifiers and confirmed the operative site. Anesthesia was induced by the anesthesia team. The prior incision was prepped and draped then opened and explored. Acute hematoma was identified deep to the platysma and evacuated. The wound was explored at all levels without a clear source of bleeding. Because no clear source was found, floseal was applied to the entire surface area of the wound, allowed to sit for hemostasis, then irrigated. Hemostasis was confirmed with multiple rounds of irrigation and cottonoid placement. A #7 flat drain was placed into the wound, tunneled inferiorly, and secured. The incision was then closed in layers. All instrument and sponge counts were correct, the incision was then closed in layers. The patient was then returned to anesthesia for emergence.    EBL:  25mL   DRAINS: #7 flat drain   SPECIMENS: none   Marie Pierinihomas A Analina Filla, MD 05/01/18 4:02 PM

## 2018-05-01 NOTE — Progress Notes (Addendum)
On arrival to PACU pt's incisional site on her neck is swollen and firm to touch. Dr. Maurice Smallstergard notified and at bedside to assess. No new orders at this time. Will continue to monitor pt.   @1506  RN called Dr. Maurice Smallstergard to update pt's incisional site is softer to touch and still swollen after placing ice to site. No new orders at this time. Per MD okay for pt to be transferred to floor.   @1530  RN called Dr. Maurice Smallstergard and notified pt's incisional site is more swollen and firmer after getting up to the bathroom. Dr. Maurice Smallstergard en route to assess pt.

## 2018-05-02 ENCOUNTER — Encounter (HOSPITAL_COMMUNITY): Payer: Self-pay | Admitting: Neurological Surgery

## 2018-05-02 NOTE — Progress Notes (Signed)
Neurosurgery Service Progress Note  Subjective: No acute events overnight after hematoma evacuation, preoperative arm and neck pain still completely resolved, some odynophagia but no dysphagia  Objective: Vitals:   05/01/18 1900 05/01/18 2000 05/01/18 2343 05/02/18 0400  BP: 121/81 122/81 118/72   Pulse: 78 77    Resp: 16 16    Temp:  98.2 F (36.8 C) 97.9 F (36.6 C) 98.2 F (36.8 C)  TempSrc:  Oral Oral Oral  SpO2: 94% 95%    Weight:      Height:       Temp (24hrs), Avg:97.8 F (36.6 C), Min:97.5 F (36.4 C), Max:98.2 F (36.8 C)  CBC Latest Ref Rng & Units 05/01/2018 03/06/2017 10/29/2015  WBC 4.0 - 10.5 K/uL 6.8 9.7 11.4(H)  Hemoglobin 12.0 - 15.0 g/dL 13.2 44.0 10.2(L)  Hematocrit 36.0 - 46.0 % 43.5 43.7 31.1(L)  Platelets 150 - 400 K/uL 299 236 198   BMP Latest Ref Rng & Units 05/01/2018 03/06/2017 10/27/2015  Glucose 70 - 99 mg/dL 95 93 91  BUN 6 - 20 mg/dL 9 10 11   Creatinine 0.44 - 1.00 mg/dL 1.02 7.25 3.66  Sodium 135 - 145 mmol/L 139 137 133(L)  Potassium 3.5 - 5.1 mmol/L 3.9 4.4 4.1  Chloride 98 - 111 mmol/L 108 106 104  CO2 22 - 32 mmol/L 24 22 20(L)  Calcium 8.9 - 10.3 mg/dL 8.9 9.2 4.4(I)    Intake/Output Summary (Last 24 hours) at 05/02/2018 0845 Last data filed at 05/02/2018 0400 Gross per 24 hour  Intake 2553 ml  Output 80 ml  Net 2473 ml    Current Facility-Administered Medications:  .  0.9 %  sodium chloride infusion, 250 mL, Intravenous, Continuous, Seve Monette A, MD .  acetaminophen (TYLENOL) tablet 650 mg, 650 mg, Oral, Q4H PRN **OR** acetaminophen (TYLENOL) suppository 650 mg, 650 mg, Rectal, Q4H PRN, Jadene Pierini, MD .  cyclobenzaprine (FLEXERIL) tablet 10 mg, 10 mg, Oral, TID PRN, Jadene Pierini, MD, 10 mg at 05/01/18 1443 .  HYDROmorphone (DILAUDID) injection 0.5 mg, 0.5 mg, Intravenous, Q2H PRN, Jadene Pierini, MD, 0.5 mg at 05/02/18 0339 .  lactated ringers infusion, , Intravenous, Continuous, Mal Amabile, MD,  Last Rate: 10 mL/hr at 05/01/18 0714 .  loratadine (CLARITIN) tablet 5 mg, 5 mg, Oral, Daily PRN, Leonides Minder A, MD .  menthol-cetylpyridinium (CEPACOL) lozenge 3 mg, 1 lozenge, Oral, PRN **OR** phenol (CHLORASEPTIC) mouth spray 1 spray, 1 spray, Mouth/Throat, PRN, Jossalin Chervenak A, MD .  montelukast (SINGULAIR) tablet 10 mg, 10 mg, Oral, QHS, Linus Weckerly, Clovis Pu, MD, 10 mg at 05/01/18 2202 .  MUSCLE RUB CREA 1 application, 1 application, Topical, Daily PRN, Tryston Gilliam A, MD .  olopatadine (PATANOL) 0.1 % ophthalmic solution 1 drop, 1 drop, Both Eyes, BID PRN, Omni Dunsworth A, MD .  ondansetron (ZOFRAN) tablet 4 mg, 4 mg, Oral, Q6H PRN **OR** ondansetron (ZOFRAN) injection 4 mg, 4 mg, Intravenous, Q6H PRN, Jadene Pierini, MD, 4 mg at 05/02/18 0411 .  oxyCODONE (Oxy IR/ROXICODONE) immediate release tablet 10 mg, 10 mg, Oral, Q3H PRN, Jadene Pierini, MD .  oxyCODONE (Oxy IR/ROXICODONE) immediate release tablet 5 mg, 5 mg, Oral, Q3H PRN, Jadene Pierini, MD .  pantoprazole (PROTONIX) EC tablet 40 mg, 40 mg, Oral, Daily, Koi Zangara A, MD .  sertraline (ZOLOFT) tablet 150 mg, 150 mg, Oral, Daily, Joice Nazario A, MD .  sodium chloride flush (NS) 0.9 % injection 3 mL, 3 mL, Intravenous, Q12H, Diasia Henken,  Clovis Puhomas A, MD, 3 mL at 05/01/18 2213 .  sodium chloride flush (NS) 0.9 % injection 3 mL, 3 mL, Intravenous, PRN, Jadene Pierinistergard, Hilton Saephan A, MD   Physical Exam: AOx3, PERRL, EOMI, FS, Strength 5/5 x4, SILTx4 - R C7 numbness almost completely resolved  Assessment & Plan: 42 y.o. woman s/p ACDF c/b post-op anterior cervical hematoma s/p evacuation, recovering well. -ADAT, activity as tolerated -PT/OT -SCDs, TEDs, hold SQH given hematoma -continue drain -will d/c drain tomorrow and discharge if pt continues to do well  Jadene Pierinihomas A Musa Rewerts  05/02/18 8:45 AM

## 2018-05-02 NOTE — Evaluation (Signed)
Physical Therapy Evaluation Patient Details Name: [name] MRN: [id number] DOB: 09/28/1975 Today's Date: 05/02/2018   History of Present Illness  42 yo s/p C 6-7 ACDF with post op evacuation of anterior hematoma  Clinical Impression  Patient seen for mobility assessment s/p spinal surgery. Mobilizing well. Educated patient on precautions, mobility expectations, safety and car transfers. No further acute PT needs. Will sign off.     Follow Up Recommendations No PT follow up    Equipment Recommendations  None recommended by PT    Recommendations for Other Services       Precautions / Restrictions Precautions Precautions: Cervical Precaution Booklet Issued: Yes (comment) Precaution Comments: REviewed precautions with pt/husband      Mobility  Bed Mobility Overal bed mobility: Needs Assistance Bed Mobility: Sidelying to Sit   Sidelying to sit: Supervision       General bed mobility comments: education on proper technique for bed precautions  Transfers Overall transfer level: Needs assistance   Transfers: Sit to/from Stand Sit to Stand: Supervision         General transfer comment: limited by nausea; states she has been up and walking with nsg to the bathroom without difficulty  Ambulation/Gait Ambulation/Gait assistance: Supervision Gait Distance (Feet): 510 Feet Assistive device: None Gait Pattern/deviations: WFL(Within Functional Limits)     General Gait Details: steady with ambulation  Stairs            Wheelchair Mobility    Modified Rankin (Stroke Patients Only)       Balance Overall balance assessment: Modified Independent                                           Pertinent Vitals/Pain Pain Score: 4  Pain Location: neck/throat Pain Descriptors / Indicators: Sore Pain Intervention(s): Monitored during session    Home Living Family/patient expects to be discharged to:: Private residence   Available Help at  Discharge: Family;Friend(s);Available 24 hours/day Type of Home: House Home Access: Stairs to enter   [employer] Corporation[employer]trance Stairs-Number of Steps: 1 Home Layout: Two level;Able to live on main level with bedroom/bathroom Home Equipment: Shower seat - built in;Hand held shower head      Prior Function Level of Independence: Independent         Comments: Insurance underwritersubcontract for engineers; 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     Hand Dominance   Dominant Hand: Right    Extremity/Trunk Assessment   Upper Extremity Assessment Upper Extremity Assessment: Overall WFL for tasks assessed    Lower Extremity Assessment Lower Extremity Assessment: Overall WFL for tasks assessed    Cervical / Trunk Assessment Cervical / Trunk Assessment: Other exceptions(s/p cervical surgery)  Communication   Communication: No difficulties  Cognition Arousal/Alertness: Awake/alert Behavior During Therapy: WFL for tasks assessed/performed Overall Cognitive Status: Within Functional Limits for tasks assessed                                        General Comments      Exercises     Assessment/Plan    PT Assessment Patent does not need any further PT services  PT Problem List         PT Treatment Interventions      PT Goals (Current goals can be found in the Care Plan section)  Acute  Rehab PT Goals Patient Stated Goal: to have neck pain relief PT Goal Formulation: All assessment and education complete, DC therapy    Frequency     Barriers to discharge        Co-evaluation               AM-PAC PT "6 Clicks" Mobility  Outcome Measure Help needed turning from your back to your side while in a flat bed without using bedrails?: None Help needed moving from lying on your back to sitting on the side of a flat bed without using bedrails?: None Help needed moving to and from a bed to a chair (including a wheelchair)?: None Help needed standing up from a chair using your arms (e.g., wheelchair or  bedside chair)?: None Help needed to walk in hospital room?: A Little Help needed climbing 3-5 steps with a railing? : A Little 6 Click Score: 22    End of Session   Activity Tolerance: Patient tolerated treatment well Patient left: (in bathroom with family, nsg aware) Nurse Communication: Mobility status PT Visit Diagnosis: Other symptoms and signs involving the nervous system (R29.898)    Time: 1554-1610 PT Time Calculation (min) (ACUTE ONLY): 16 min   Charges:   PT Evaluation $PT Eval Low Complexity: 1 Low          Charlotte Crumb, PT DPT  Board Certified Neurologic Specialist Acute Rehabilitation Services Pager [phone] Office [phone]   Fabio Asa 05/02/2018, 5:27 PM

## 2018-05-02 NOTE — Progress Notes (Signed)
Occupational Therapy Evaluation Patient Details Name: Marie ApplCasey C Rymer MRN: [id number] DOB: 12/09/1975 Today's Date: 05/02/2018    History of Present Illness 42 yo s/p C 6-7 ACDF with post op evacuation of anterior hematoma   Clinical Impression   PTA, pt independent with ADL and mobility, has 4 children and subcontracts as an Art gallery managerengineer. Completed all education regarding cervical precautions and ADL and functional mobility for ADL - handout reviewed. Session limited due to nausea. Education completed with husband who is able to assist pt as needed at DC. [facility]. Pt reports this is much improved as compared to PTA. OT signing off.     Follow Up Recommendations  No OT follow up;Supervision - Intermittent    Equipment Recommendations  None recommended by OT    Recommendations for Other Services       Precautions / Restrictions Precautions Precautions: Cervical Precaution Booklet Issued: Yes (comment) Precaution Comments: REviewed precautions with pt/husband      Mobility Bed Mobility Overal bed mobility: Needs Assistance Bed Mobility: Sidelying to Sit   Sidelying to sit: Supervision       General bed mobility comments: education on proper technique for bed precautions; recommended sleeping with HOB elevated  Transfers Overall transfer level: Needs assistance   Transfers: Sit to/from Stand Sit to Stand: Supervision         General transfer comment: limited by nausea; states she has been up and walking with nsg to the bathroom without difficulty    Balance Overall balance assessment: Modified Independent                                         ADL either performed or assessed with clinical judgement   ADL Overall ADL's : Needs assistance/impaired                                     Functional mobility during ADLs: Supervision/safety(limited due to nausea) General ADL Comments: Completed education with  pt/husband regarding cervical precautions for ADL adn funcitonal mobility for ADL. Handout reviewed. Pt/husband verbalized understanding.      Vision         Perception     Praxis      Pertinent Vitals/Pain Pain Assessment: 0-10 Pain Score: 5  Pain Location: neck/throat Pain Descriptors / Indicators: Aching;Discomfort Pain Intervention(s): Limited activity within patient's tolerance     Hand Dominance Right   Extremity/Trunk Assessment Upper Extremity Assessment Upper Extremity Assessment: Overall WFL for tasks assessed   Lower Extremity Assessment Lower Extremity Assessment: Defer to PT evaluation   Cervical / Trunk Assessment Cervical / Trunk Assessment: Other exceptions(s/p cervical surgery)   Communication Communication Communication: No difficulties   Cognition Arousal/Alertness: Awake/alert Behavior During Therapy: WFL for tasks assessed/performed Overall Cognitive Status: Within Functional Limits for tasks assessed                                     General Comments       Exercises     Shoulder Instructions      Home Living Family/patient expects to be discharged to:: Private residence   Available Help at Discharge: Family;Friend(s);Available 24 hours/day Type of Home: House Home Access: Stairs to enter [employer]Entrance Stairs-Number of  Steps: 1   Home Layout: Two level;Able to live on main level with bedroom/bathroom     Bathroom Shower/Tub: Producer, television/film/video: Standard Bathroom Accessibility: Yes   Home Equipment: Shower seat - built in;Hand held shower head          Prior Functioning/Environment Level of Independence: Independent        Comments: Insurance underwriter; 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        OT Problem List: Pain      OT Treatment/Interventions:      OT Goals(Current goals can be found in the care plan section) Acute Rehab OT Goals Patient Stated Goal: to have neck pain relief OT Goal Formulation:  All assessment and education complete, DC therapy  OT Frequency:     Barriers to D/C:            Co-evaluation              AM-PAC OT "6 Clicks" Daily Activity     Outcome Measure Help from another person eating meals?: None Help from another person taking care of personal grooming?: A Little Help from another person toileting, which includes using toliet, bedpan, or urinal?: None Help from another person bathing (including washing, rinsing, drying)?: A Little Help from another person to put on and taking off regular upper body clothing?: A Little Help from another person to put on and taking off regular lower body clothing?: None 6 Click Score: 21   End of Session Nurse Communication: Mobility status  Activity Tolerance: Other (comment)(limited by nausea) Patient left: in bed;with call bell/phone within reach;with family/visitor present  OT Visit Diagnosis: Pain Pain - part of body: (neck)                Time: 1610-9604 OT Time Calculation (min): 25 min Charges:  OT General Charges $OT Visit: 1 Visit OT Evaluation $OT Eval Low Complexity: 1 Low OT Treatments $Self Care/Home Management : 8-22 mins  Luisa Dago, OT/L   Acute OT Clinical Specialist Acute Rehabilitation Services Pager [phone] Office [phone]   [facility] 05/02/2018, 10:14 AM

## 2018-05-02 NOTE — Discharge Summary (Signed)
Discharge Summary  Date of Admission: 05/01/2018  Date of Discharge: 05/03/2018  Attending Physician: Autumn Pattyhomas Cornelius Marullo, MD  Hospital Course: Patient was taken to the OR on 05/01/18 for a C6-7 ACDF. A few hours after surgery, while still in PACU, she began having swelling at her incision site, consistent with a post-operative anterior cervical hematoma. She was therefore taken back to the OR on 05/01/18 for evacuation of an anterior cervical hematoma. She was recovered and transferred to step-down for monitoring and did well. An anterior cervical drain put out minimal blood products. It was removed on POD2 and she was discharged home. At the time of discharge, she was ambulating well and tolerating a regular diet. Her preoperative severe right upper extremity pain and numbness were completely resolved. Her hospital course was otherwise uncomplicated and the patient was discharged home on 05/03/18. She will follow up in clinic with me in 2 weeks.  Neurologic exam at discharge:  AOx3, PERRL, EOMI, FS, TM Strength 5/5 x4, SILTx4  Jadene Pierinihomas A Cayle Cordoba, MD 05/02/18 4:09 PM

## 2018-05-02 NOTE — Social Work (Signed)
CSW acknowledging consult for SNF placement. Will follow for therapy recommendations.   Tyresa Prindiville, MSW, LCSWA [place] Clinical Social Work [phone]   

## 2018-05-03 MED ORDER — ONDANSETRON 4 MG PO TBDP: 4.0000 mg | ORAL_TABLET | Freq: Three times a day (TID) | ORAL | 0 refills | Status: DC | PRN

## 2018-05-03 MED ORDER — OXYCODONE HCL 5 MG PO TABS: 5.0000 mg | ORAL_TABLET | ORAL | 0 refills | Status: DC | PRN

## 2018-05-03 MED ORDER — CYCLOBENZAPRINE HCL 10 MG PO TABS: 10.0000 mg | ORAL_TABLET | Freq: Three times a day (TID) | ORAL | 2 refills | Status: DC | PRN

## 2018-05-03 MED ORDER — PROMETHAZINE HCL 25 MG/ML IJ SOLN
25.0000 mg | Freq: Four times a day (QID) | INTRAMUSCULAR | Status: DC | PRN
  Administered 2018-05-03: 25 mg via INTRAVENOUS
  Filled 2018-05-03: qty 1

## 2018-05-03 MED ORDER — DOCUSATE SODIUM 100 MG PO CAPS: 100.0000 mg | ORAL_CAPSULE | Freq: Every day | ORAL | 2 refills | Status: AC | PRN

## 2018-05-03 MED ORDER — PROMETHAZINE HCL 12.5 MG PO TABS: 12.5000 mg | ORAL_TABLET | Freq: Four times a day (QID) | ORAL | 0 refills | Status: DC | PRN

## 2018-05-03 NOTE — Plan of Care (Signed)

## 2018-05-03 NOTE — Progress Notes (Signed)
Neurosurgery Service Progress Note  Subjective: No acute events overnight, had some headaches and N/V but resolved with promethazine, preoperative arm and neck pain still completely resolved, otherwise good po intake  Objective: Vitals:   05/02/18 2000 05/03/18 0000 05/03/18 0500 05/03/18 0834  BP: 124/78 123/67 (!) 141/77 136/75  Pulse:    80  Resp:    14  Temp: 99.3 F (37.4 C) 98.1 F (36.7 C) (!) 97.5 F (36.4 C) 98 F (36.7 C)  TempSrc: Oral Oral Oral Oral  SpO2: 98% 92%    Weight:      Height:       Temp (24hrs), Avg:98.1 F (36.7 C), Min:97.5 F (36.4 C), Max:99.3 F (37.4 C)  CBC Latest Ref Rng & Units 05/01/2018 03/06/2017 10/29/2015  WBC 4.0 - 10.5 K/uL 6.8 9.7 11.4(H)  Hemoglobin 12.0 - 15.0 g/dL 16.1 09.6 10.2(L)  Hematocrit 36.0 - 46.0 % 43.5 43.7 31.1(L)  Platelets 150 - 400 K/uL 299 236 198   BMP Latest Ref Rng & Units 05/01/2018 03/06/2017 10/27/2015  Glucose 70 - 99 mg/dL 95 93 91  BUN 6 - 20 mg/dL 9 10 11   Creatinine 0.44 - 1.00 mg/dL 0.45 4.09 8.11  Sodium 135 - 145 mmol/L 139 137 133(L)  Potassium 3.5 - 5.1 mmol/L 3.9 4.4 4.1  Chloride 98 - 111 mmol/L 108 106 104  CO2 22 - 32 mmol/L 24 22 20(L)  Calcium 8.9 - 10.3 mg/dL 8.9 9.2 9.1(Y)    Intake/Output Summary (Last 24 hours) at 05/03/2018 0844 Last data filed at 05/03/2018 0600 Gross per 24 hour  Intake 500 ml  Output -  Net 500 ml    Current Facility-Administered Medications:  .  0.9 %  sodium chloride infusion, 250 mL, Intravenous, Continuous, Lysha Schrade A, MD .  acetaminophen (TYLENOL) tablet 650 mg, 650 mg, Oral, Q4H PRN **OR** acetaminophen (TYLENOL) suppository 650 mg, 650 mg, Rectal, Q4H PRN, Jadene Pierini, MD .  cyclobenzaprine (FLEXERIL) tablet 10 mg, 10 mg, Oral, TID PRN, Jadene Pierini, MD, 10 mg at 05/01/18 1443 .  HYDROmorphone (DILAUDID) injection 0.5 mg, 0.5 mg, Intravenous, Q2H PRN, Jadene Pierini, MD, 0.5 mg at 05/03/18 0404 .  lactated ringers infusion, ,  Intravenous, Continuous, Mal Amabile, MD, Last Rate: 10 mL/hr at 05/01/18 0714 .  loratadine (CLARITIN) tablet 5 mg, 5 mg, Oral, Daily PRN, Ademola Vert A, MD .  menthol-cetylpyridinium (CEPACOL) lozenge 3 mg, 1 lozenge, Oral, PRN **OR** phenol (CHLORASEPTIC) mouth spray 1 spray, 1 spray, Mouth/Throat, PRN, Luisdaniel Kenton A, MD .  montelukast (SINGULAIR) tablet 10 mg, 10 mg, Oral, QHS, Giannina Bartolome, Clovis Pu, MD, 10 mg at 05/02/18 2228 .  MUSCLE RUB CREA 1 application, 1 application, Topical, Daily PRN, Fatuma Dowers A, MD .  olopatadine (PATANOL) 0.1 % ophthalmic solution 1 drop, 1 drop, Both Eyes, BID PRN, Jadene Pierini, MD .  ondansetron (ZOFRAN) tablet 4 mg, 4 mg, Oral, Q6H PRN, 4 mg at 05/02/18 2228 **OR** ondansetron (ZOFRAN) injection 4 mg, 4 mg, Intravenous, Q6H PRN, Jadene Pierini, MD, 4 mg at 05/02/18 1635 .  oxyCODONE (Oxy IR/ROXICODONE) immediate release tablet 10 mg, 10 mg, Oral, Q3H PRN, Jadene Pierini, MD .  oxyCODONE (Oxy IR/ROXICODONE) immediate release tablet 5 mg, 5 mg, Oral, Q3H PRN, Jadene Pierini, MD .  pantoprazole (PROTONIX) EC tablet 40 mg, 40 mg, Oral, Daily, Jadene Pierini, MD, 40 mg at 05/02/18 0906 .  promethazine (PHENERGAN) injection 25 mg, 25 mg, Intravenous, Q6H PRN,  Jadene Pierinistergard, Raphael Espe A, MD, 25 mg at 05/03/18 0403 .  sertraline (ZOLOFT) tablet 150 mg, 150 mg, Oral, Daily, Jadene Pierinistergard, Abdias Hickam A, MD, 150 mg at 05/02/18 0905 .  sodium chloride flush (NS) 0.9 % injection 3 mL, 3 mL, Intravenous, Q12H, Mariafernanda Hendricksen, Clovis Puhomas A, MD, 3 mL at 05/02/18 2229 .  sodium chloride flush (NS) 0.9 % injection 3 mL, 3 mL, Intravenous, PRN, Jadene Pierinistergard, Sukhmani Fetherolf A, MD   Physical Exam: AOx3, PERRL, EOMI, FS, Strength 5/5 x4, SILTx4 - R C7 numbness completely resolved  Assessment & Plan: 42 y.o. woman s/p ACDF c/b post-op anterior cervical hematoma s/p evacuation, recovering well. -d/c drain -discharge home today  Jadene Pierinihomas A Yasir Kitner  05/03/18 8:44  AM

## 2018-05-03 NOTE — Progress Notes (Signed)
Patient ready for discharge home with family. They are at bedside. Reviewed all discharge instructions including prescriptions and f/u appointments.

## 2018-05-07 ENCOUNTER — Encounter (HOSPITAL_COMMUNITY): Payer: Self-pay | Admitting: Neurological Surgery

## 2018-05-07 NOTE — Anesthesia Postprocedure Evaluation (Signed)
Anesthesia Post Note  Patient: Mora ApplCasey C Satterly  Procedure(s) Performed: EVACUATION OF CERVICAL HEMATOMA (N/A Neck)     Patient location during evaluation: PACU Anesthesia Type: General Level of consciousness: awake and alert Pain management: pain level controlled Vital Signs Assessment: post-procedure vital signs reviewed and stable Respiratory status: spontaneous breathing, nonlabored ventilation, respiratory function stable and patient connected to nasal cannula oxygen Cardiovascular status: blood pressure returned to baseline and stable Postop Assessment: no apparent nausea or vomiting Anesthetic complications: no    Last Vitals:  Vitals:   05/03/18 0834 05/03/18 1148  BP: 136/75 128/80  Pulse: 80 79  Resp: 14   Temp: 36.7 C 37.3 C  SpO2:      Last Pain:  Vitals:   05/03/18 1148  TempSrc: Oral  PainSc:                  Deontrae Drinkard

## 2018-05-21 ENCOUNTER — Ambulatory Visit (INDEPENDENT_AMBULATORY_CARE_PROVIDER_SITE_OTHER): Payer: BLUE CROSS/BLUE SHIELD | Admitting: Family Medicine

## 2018-05-21 ENCOUNTER — Encounter: Payer: Self-pay | Admitting: Family Medicine

## 2018-05-21 VITALS — BP 118/82 | Temp 98.5°F | Ht 65.0 in | Wt 170.1 lb

## 2018-05-21 DIAGNOSIS — J329 Chronic sinusitis, unspecified: Secondary | ICD-10-CM | POA: Diagnosis not present

## 2018-05-21 MED ORDER — AZITHROMYCIN 250 MG PO TABS: ORAL_TABLET | ORAL | 0 refills | Status: DC

## 2018-05-21 NOTE — Telephone Encounter (Signed)
Patient called and given appointments as requested

## 2018-05-21 NOTE — Progress Notes (Signed)
   Subjective:    Patient ID: [name], female    DOB: 09/07/1975, 42 y.o.   MRN: [id number]  HPI Patient is here today with complaints of cough,runny nose,bilateral ear pain,headache,sore throat the symptoms started two days ago. She has been taking Tylenol.   Sinuses started fifteen times of sneezing in a row  Overnight into the throat and the chest and the ears  Energy doable  Frontal headache.  Sharp at times.  Achy at other times.  Cough is productive.      Review of Systems No high fever no chills no rash    Objective:   Physical Exam  Alert, mild malaise. Hydration good Vitals stable. frontal/ maxillary tenderness evident positive nasal congestion. pharynx normal neck supple  lungs clear/no crackles or wheezes. heart regular in rhythm       Assessment & Plan:  Impression rhinosinusitis likely post viral, discussed with patient. plan antibiotics prescribed. Questions answered. Symptomatic care discussed. warning signs discussed. WSL

## 2018-05-22 ENCOUNTER — Ambulatory Visit: Payer: BLUE CROSS/BLUE SHIELD | Admitting: Allergy and Immunology

## 2018-06-03 ENCOUNTER — Encounter: Payer: Self-pay | Admitting: Family Medicine

## 2018-06-11 ENCOUNTER — Other Ambulatory Visit: Payer: Self-pay | Admitting: *Deleted

## 2018-06-11 MED ORDER — MONTELUKAST SODIUM 10 MG PO TABS: 10.0000 mg | ORAL_TABLET | Freq: Every day | ORAL | 0 refills | Status: DC

## 2018-06-18 ENCOUNTER — Other Ambulatory Visit: Payer: Self-pay | Admitting: Allergy and Immunology

## 2018-07-23 ENCOUNTER — Ambulatory Visit: Payer: BLUE CROSS/BLUE SHIELD | Admitting: Allergy and Immunology

## 2018-08-09 ENCOUNTER — Other Ambulatory Visit: Payer: Self-pay

## 2018-08-10 ENCOUNTER — Other Ambulatory Visit: Payer: Self-pay

## 2018-08-10 MED ORDER — LEVOCETIRIZINE DIHYDROCHLORIDE 5 MG PO TABS: 5.0000 mg | ORAL_TABLET | Freq: Every day | ORAL | 5 refills | Status: DC | PRN

## 2018-09-10 ENCOUNTER — Other Ambulatory Visit: Payer: Self-pay | Admitting: Allergy and Immunology

## 2018-09-10 ENCOUNTER — Other Ambulatory Visit: Payer: Self-pay | Admitting: Family Medicine

## 2018-09-10 NOTE — Telephone Encounter (Signed)
All patients on ranitidine should be switched to famotidine.  In this patient's case, she should take famotidine (Pepcid) 20 mg 1-2 times daily as needed.  Thanks.

## 2018-09-10 NOTE — Telephone Encounter (Signed)
Okay to fill or do you want to switch to Pepcid?

## 2018-10-03 ENCOUNTER — Other Ambulatory Visit: Payer: Self-pay | Admitting: Allergy and Immunology

## 2018-10-07 ENCOUNTER — Other Ambulatory Visit: Payer: Self-pay | Admitting: Allergy and Immunology

## 2018-11-01 ENCOUNTER — Other Ambulatory Visit: Payer: Self-pay

## 2018-11-02 ENCOUNTER — Other Ambulatory Visit: Payer: Self-pay | Admitting: *Deleted

## 2018-11-12 ENCOUNTER — Encounter: Payer: Self-pay | Admitting: Family Medicine

## 2018-11-12 NOTE — Telephone Encounter (Signed)
Contacted patient mom [name] and transferred her up front to set up virtual appt

## 2018-12-03 ENCOUNTER — Other Ambulatory Visit: Payer: Self-pay | Admitting: Family Medicine

## 2019-04-24 ENCOUNTER — Encounter: Payer: Self-pay | Admitting: Family Medicine

## 2019-04-25 ENCOUNTER — Other Ambulatory Visit: Payer: BLUE CROSS/BLUE SHIELD

## 2019-05-06 ENCOUNTER — Encounter: Payer: Self-pay | Admitting: Family Medicine

## 2019-05-07 ENCOUNTER — Other Ambulatory Visit: Payer: Self-pay

## 2019-05-07 ENCOUNTER — Other Ambulatory Visit (INDEPENDENT_AMBULATORY_CARE_PROVIDER_SITE_OTHER): Payer: No Typology Code available for payment source | Admitting: *Deleted

## 2019-05-07 DIAGNOSIS — Z23 Encounter for immunization: Secondary | ICD-10-CM

## 2019-05-08 ENCOUNTER — Other Ambulatory Visit: Payer: BC Managed Care – PPO

## 2019-05-14 ENCOUNTER — Encounter: Payer: Self-pay | Admitting: Family Medicine

## 2019-05-14 NOTE — Telephone Encounter (Signed)
Mikey Kirschner, MD  You 1 hour ago (1:44 PM)   Test sullie and sadie,yes. [place] sullie only full ten d due to exposure. Qarantine saidie until results return neg

## 2019-05-16 NOTE — Telephone Encounter (Signed)
Patient scheduled virtual visit today with Dr Steve 

## 2019-05-21 ENCOUNTER — Encounter: Payer: Self-pay | Admitting: Family Medicine

## 2019-05-21 MED ORDER — DOXYCYCLINE HYCLATE 100 MG PO TABS: ORAL_TABLET | ORAL | 0 refills | Status: DC

## 2019-05-28 ENCOUNTER — Encounter: Payer: Self-pay | Admitting: Family Medicine

## 2019-07-08 ENCOUNTER — Encounter: Payer: Self-pay | Admitting: Family Medicine

## 2019-07-10 IMAGING — MR MR CERVICAL SPINE W/O CM
4 of 5 series · 14 of 48 positions shown · non-contrast
Comparison: Prior radiographs from 04/10/2018.

CLINICAL DATA: Initial evaluation for right neck and shoulder pain.

EXAM:
MRI CERVICAL SPINE WITHOUT CONTRAST
TECHNIQUE: Multiplanar, multisequence MR imaging of the cervical spine was
performed. No intravenous contrast was administered.

[Series 3: T2 · sagittal · 3.0mm · 0.39mm/px · 5 of 13 slices shown (1 of 2)]
[im 1/13]
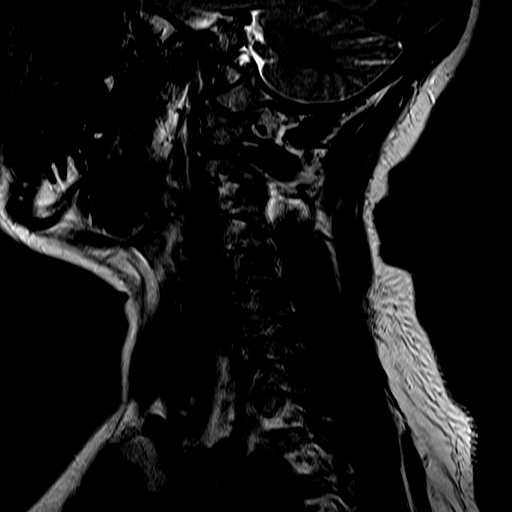
[im 3/13]
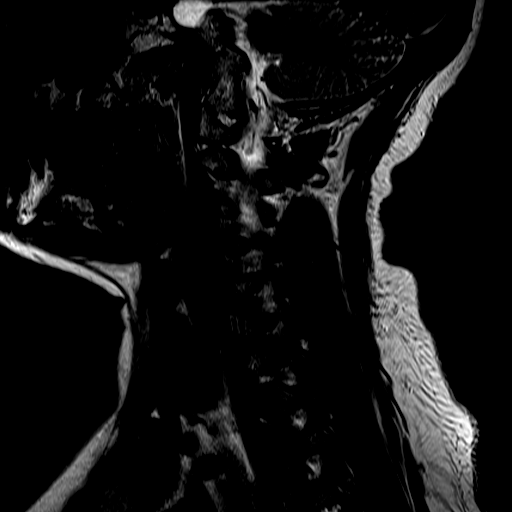
[im 5/13]
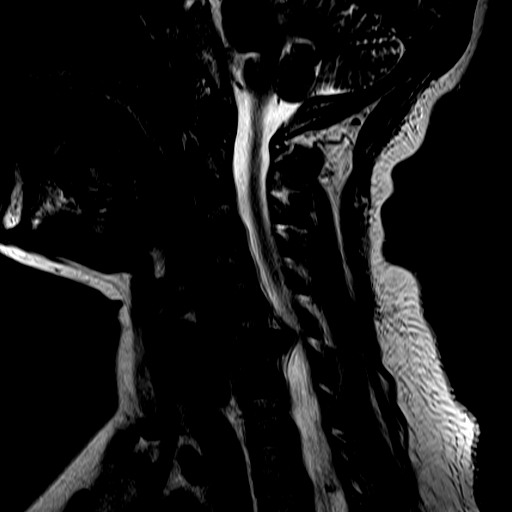
[im 8/13]
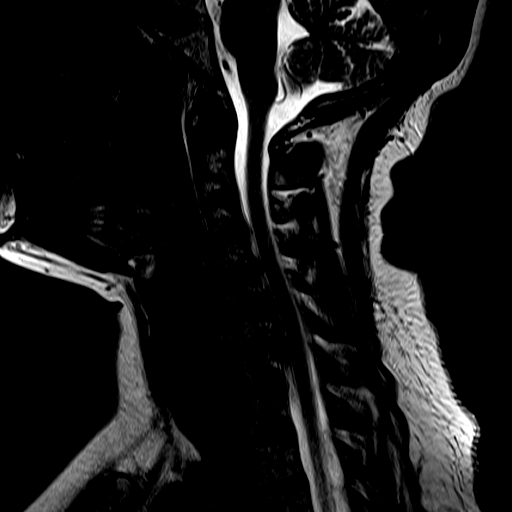
[im 13/13]
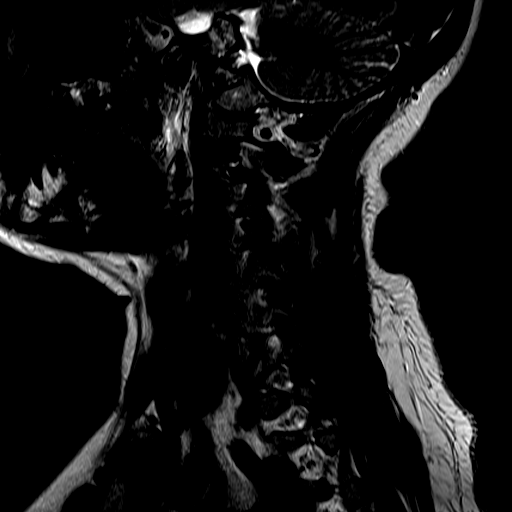

[Series 4: FLAIR · sagittal · 3.0mm · 0.45mm/px · 3 of 13 slices shown]
[im 3/13]
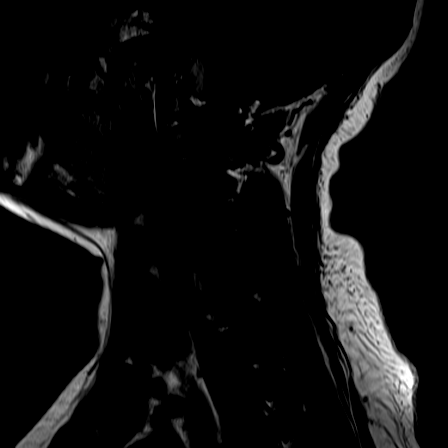
[im 8/13]
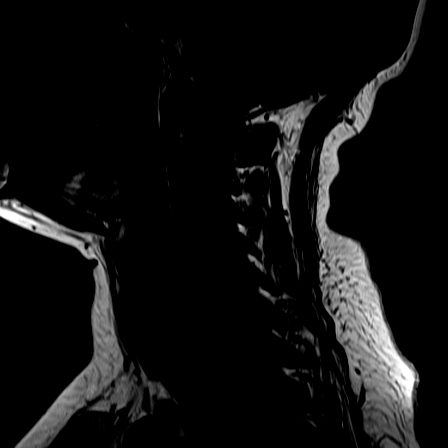
[im 13/13]
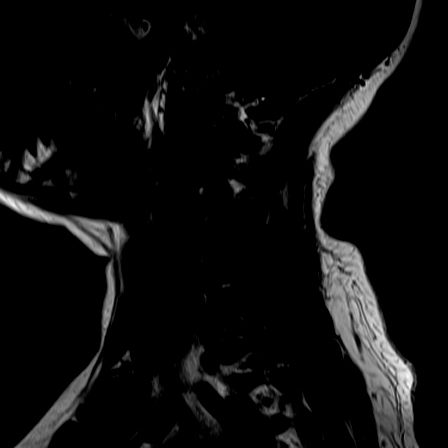

[Series 5: ir sagital · sagittal · 3.0mm · 0.22mm/px · 3 of 13 slices shown]
[im 3/13]
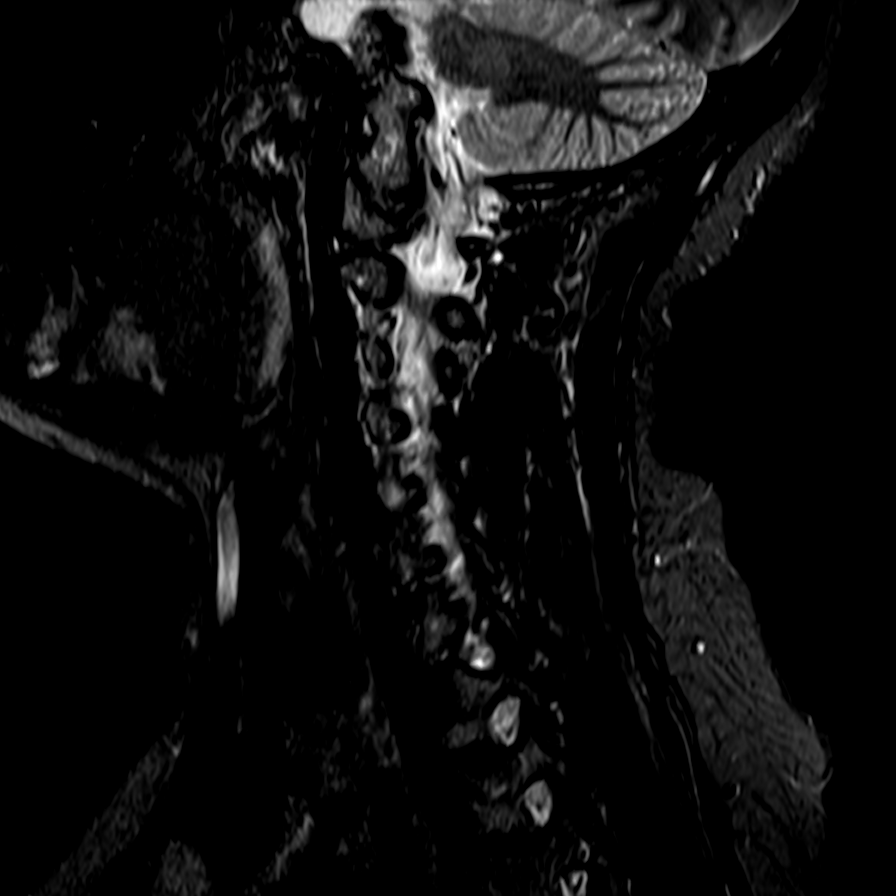
[im 8/13]
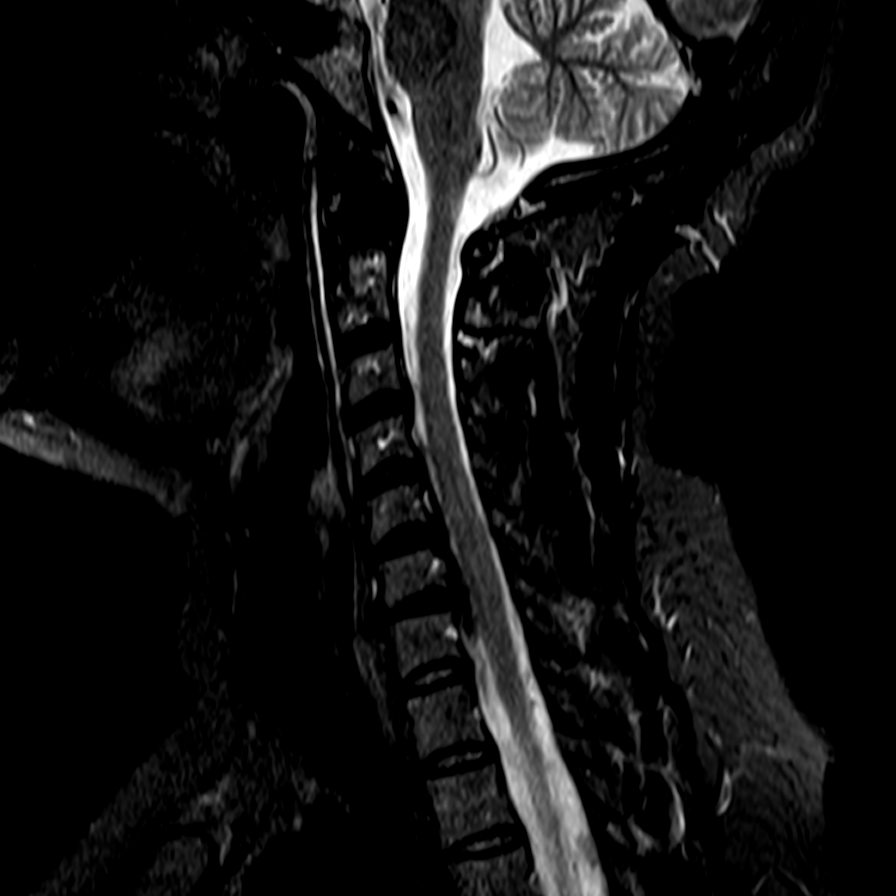
[im 13/13]
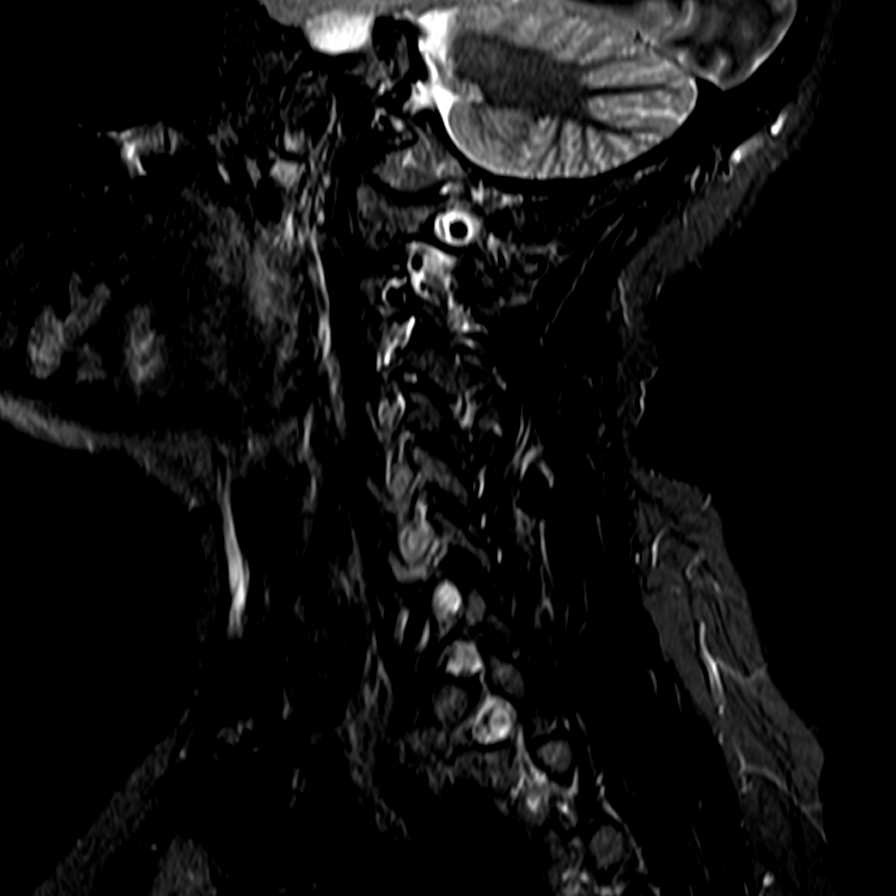

[Series 7: T2 · axial · 3.0mm · 0.26mm/px · z∈[-44,+21]mm · 3 of 31 slices shown (2 of 2)]
[im 5/31]
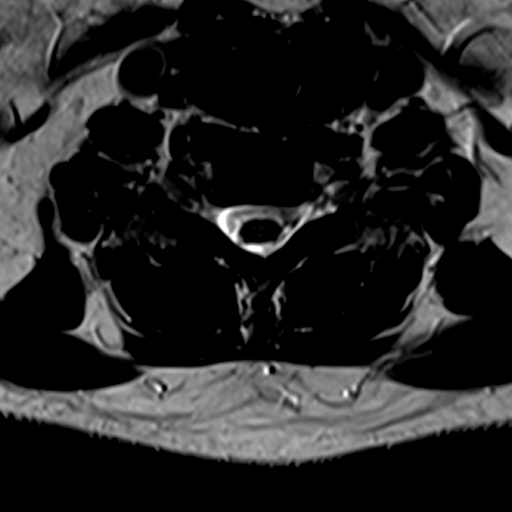
[im 16/31]
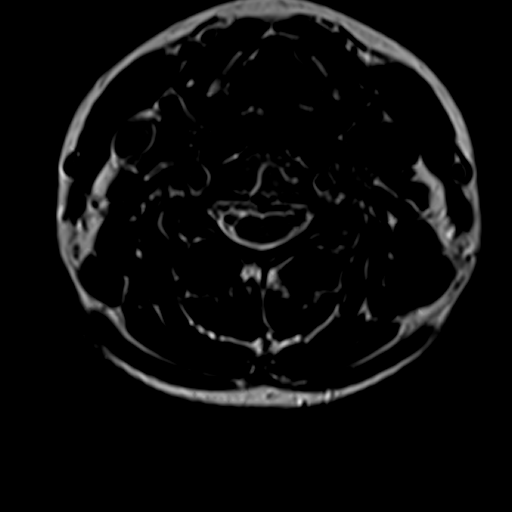
[im 26/31]
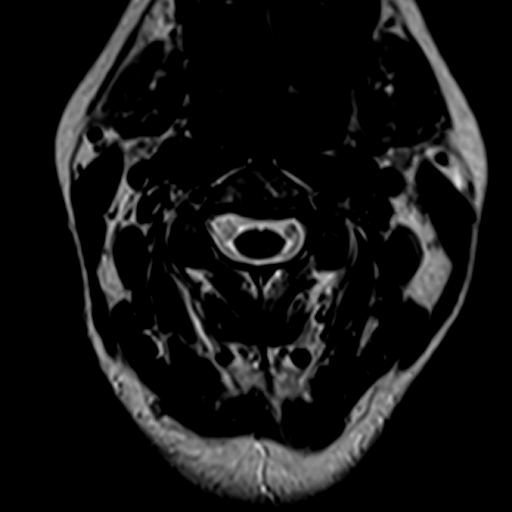

[14 of 48 positions shown; findings below may reference images not displayed]

FINDINGS: Alignment: Examination degraded by motion artifact.

Straightening of the normal cervical lordosis with trace 2 mm
retrolisthesis of C6 on C7.

Vertebrae: Vertebral body heights maintained without evidence for
acute or chronic fracture. Bone marrow signal intensity within
normal limits. Subcentimeter benign hemangioma noted within the C4
vertebral body. No other discrete or worrisome osseous lesions. No
other abnormal marrow edema.

Cord: Signal intensity within the cervical spinal cord is within
normal limits.

Posterior Fossa, vertebral arteries, paraspinal tissues: Visualized
brain and posterior fossa of within normal limits. Craniocervical
junction normal. Paraspinous and prevertebral soft tissues normal.
Normal intravascular flow voids present within the vertebral
arteries bilaterally.

Disc levels:

C2-C3: Unremarkable.

C3-C4: Mild diffuse disc bulge with uncovertebral hypertrophy. No
canal or foraminal stenosis.

C4-C5: Central disc protrusion indents the ventral thecal sac,
contacting the ventral aspect of the spinal cord. Mild flattening of
the ventral cord without cord signal changes. Mild spinal stenosis.
Foramina remain widely patent.

C5-C6: Moderate sized central disc protrusion measuring 5 mm in AP
diameter indents the ventral thecal sac. Protruding disc contacts
the ventral spinal cord with secondary cord flattening, slightly
greater on the right. No cord signal changes. Resultant mild to
moderate spinal stenosis. Foramina remain patent.

C6-C7: Large right subarticular disc extrusion (series 7, image 22
on axial sequence, series 3, image 5 on sagittal sequence).
Secondary flattening of the right hemi cord without cord signal
changes. Cord slightly displaced to the left. Superimposed uncinate
spurring with resultant moderate right foraminal stenosis. Left
neural foramen remains patent.

C7-T1:  Mild facet hypertrophy.  No stenosis.

Visualized upper thoracic spine demonstrates no significant finding.
IMPRESSION: 1. Large right subarticular disc extrusion at C6-7 with secondary
flattening of the right hemi cord. Query right C7 radiculitis.
2. Central disc protrusions at C4-5 and C5-6 with resultant mild to
moderate spinal stenosis.

## 2019-07-17 ENCOUNTER — Encounter: Payer: Self-pay | Admitting: Family Medicine

## 2019-08-08 ENCOUNTER — Encounter: Payer: Self-pay | Admitting: Family Medicine

## 2019-08-22 ENCOUNTER — Encounter: Payer: Self-pay | Admitting: Family Medicine

## 2019-08-22 NOTE — Telephone Encounter (Signed)
Merlyn Albert, MD     I think the two shot series are the best; the experts haven't decided yet but I think we will all be looking at a yearly booster of the latest covid vaccine modification covering the latest variants going forward from here on out

## 2019-10-19 ENCOUNTER — Encounter: Payer: Self-pay | Admitting: Family Medicine

## 2020-05-04 ENCOUNTER — Other Ambulatory Visit: Payer: Self-pay

## 2020-05-04 ENCOUNTER — Encounter: Payer: Self-pay | Admitting: Family Medicine

## 2020-05-04 ENCOUNTER — Ambulatory Visit (INDEPENDENT_AMBULATORY_CARE_PROVIDER_SITE_OTHER): Payer: BC Managed Care – PPO | Admitting: Family Medicine

## 2020-05-04 VITALS — HR 108 | Temp 97.6°F | Resp 16 | Ht 65.0 in | Wt 203.0 lb

## 2020-05-04 DIAGNOSIS — J019 Acute sinusitis, unspecified: Secondary | ICD-10-CM | POA: Diagnosis not present

## 2020-05-04 DIAGNOSIS — R051 Acute cough: Secondary | ICD-10-CM | POA: Insufficient documentation

## 2020-05-04 DIAGNOSIS — B9689 Other specified bacterial agents as the cause of diseases classified elsewhere: Secondary | ICD-10-CM

## 2020-05-04 DIAGNOSIS — J45991 Cough variant asthma: Secondary | ICD-10-CM | POA: Insufficient documentation

## 2020-05-04 MED ORDER — BENZONATATE 100 MG PO CAPS: 100.0000 mg | ORAL_CAPSULE | Freq: Two times a day (BID) | ORAL | 0 refills | Status: DC | PRN

## 2020-05-04 MED ORDER — ALBUTEROL SULFATE (2.5 MG/3ML) 0.083% IN NEBU: 2.5000 mg | INHALATION_SOLUTION | Freq: Four times a day (QID) | RESPIRATORY_TRACT | 1 refills | Status: DC | PRN

## 2020-05-04 MED ORDER — AZITHROMYCIN 250 MG PO TABS: ORAL_TABLET | ORAL | 0 refills | Status: DC

## 2020-05-04 MED ORDER — PREDNISONE 10 MG PO TABS: ORAL_TABLET | ORAL | 0 refills | Status: DC

## 2020-05-04 NOTE — Progress Notes (Signed)
Patient ID: [name], female    DOB: [birth year], 44 y.o.   MRN: [id number]   Chief Complaint  Patient presents with  . Cough   Subjective:  CC: Cough that will not go away  Presents today with a complaint of a cough that will not go away.  Associated symptoms include laryngitis that has improved, shortness of breath during coughing and feels like she cannot take a deep breath.  Her chest feels tightness when she coughs.  Symptoms started 3.5 weeks ago with a cough, and laryngitis was 2.5 weeks ago she has been using albuterol nebulizer that has helped.  She is also been taking ibuprofen fairly regularly.  Denies fever, chills, fatigue, headache.   cough for 2 and a half weeks.    Medical History [name] has a past medical history of Depression, Exercise-induced asthma, GERD (gastroesophageal reflux disease), Gestational diabetes, IBS (irritable bowel syndrome), Insomnia, Migraine, Nausea, PONV (postoperative nausea and vomiting), and Urticaria.   Outpatient Encounter Medications as of 05/04/2020  Medication Sig  . butalbital-acetaminophen-caffeine (FIORICET, ESGIC) 50-325-40 MG tablet Take 1 tablet by mouth every 4 (four) hours as needed for headache.  . montelukast (SINGULAIR) 10 MG tablet TAKE 1 TABLET BY MOUTH EVERYDAY AT BEDTIME  . Multiple Vitamins-Minerals (ADULT GUMMY) CHEW Chew 2 tablets by mouth 2 (two) times a week.  Marland Kitchen omeprazole (PRILOSEC) 40 MG capsule TAKE ONE DAILY AS NEEDED FOR ACID REFLUX (Patient taking differently: Take 40 mg by mouth daily. )  . phentermine (ADIPEX-P) 37.5 MG tablet Take 37.5 mg by mouth daily as needed (appetite suppression).   . sertraline (ZOLOFT) 100 MG tablet Take 1.5 tablets (150 mg total) by mouth daily.  . [DISCONTINUED] Camphor-Menthol-Methyl Sal (TIGER BALM MUSCLE RUB EX) Apply 1 application topically daily as needed (pain).  . [DISCONTINUED] famotidine (PEPCID) 20 MG tablet Take 1 tablet (20 mg total) by mouth 2 (two) times daily.  .  [DISCONTINUED] levocetirizine (XYZAL) 5 MG tablet Take 1 tablet (5 mg total) by mouth daily as needed for allergies.  Marland Kitchen albuterol (PROVENTIL) (2.5 MG/3ML) 0.083% nebulizer solution Take 3 mLs (2.5 mg total) by nebulization every 6 (six) hours as needed for wheezing or shortness of breath.  Marland Kitchen azithromycin (ZITHROMAX) 250 MG tablet Take 2 tablets today, then 1 tablet by mouth for 4 days  . benzonatate (TESSALON) 100 MG capsule Take 1 capsule (100 mg total) by mouth 2 (two) times daily as needed for cough.  . predniSONE (DELTASONE) 10 MG tablet Take 3 tablets for 3 days, then 2 tablets for 3 days, then 1 tablet for 3 days  . [DISCONTINUED] azithromycin (ZITHROMAX) 250 MG tablet Take as directed.  . [DISCONTINUED] cyclobenzaprine (FLEXERIL) 10 MG tablet Take 1 tablet (10 mg total) by mouth 3 (three) times daily as needed for muscle spasms. (Patient not taking: Reported on 05/21/2018)  . [DISCONTINUED] doxycycline (VIBRA-TABS) 100 MG tablet Take one tablet po BID for 10 days  . [DISCONTINUED] Olopatadine HCl 0.2 % SOLN Place 1 drop into both eyes daily as needed. (Patient taking differently: Place 1 drop into both eyes daily as needed (allergies). )  . [DISCONTINUED] ondansetron (ZOFRAN ODT) 4 MG disintegrating tablet Take 1 tablet (4 mg total) by mouth every 8 (eight) hours as needed for nausea or vomiting. (Patient not taking: Reported on 05/21/2018)  . [DISCONTINUED] OVER THE COUNTER MEDICATION Apply 1 application topically daily as needed (pain). CBD lavender cream  . [DISCONTINUED] oxyCODONE (OXY IR/ROXICODONE) 5 MG immediate release tablet Take 1  tablet (5 mg total) by mouth every 4 (four) hours as needed (pain). (Patient not taking: Reported on 05/21/2018)  . [DISCONTINUED] promethazine (PHENERGAN) 12.5 MG tablet Take 1 tablet (12.5 mg total) by mouth every 6 (six) hours as needed for nausea. (Patient not taking: Reported on 05/21/2018)   No facility-administered encounter medications on file as of  05/04/2020.     Review of Systems  Constitutional: Negative for chills and fever.  Respiratory: Positive for cough, chest tightness and shortness of breath.   Cardiovascular: Negative for chest pain.     Vitals Pulse (!) 108   Temp 97.6 F (36.4 C)   Resp 16   Ht 5\' 5"  (1.651 m)   Wt 203 lb (92.1 kg)   SpO2 98%   BMI 33.78 kg/m   Objective:   Physical Exam Vitals and nursing note reviewed.  Constitutional:      General: She is not in acute distress.    Appearance: She is ill-appearing. She is not toxic-appearing.  HENT:     Right Ear: Tympanic membrane normal.     Left Ear: Tympanic membrane normal.     Nose:     Right Sinus: Maxillary sinus tenderness present.     Left Sinus: Maxillary sinus tenderness present.     Mouth/Throat:     Mouth: Mucous membranes are moist.  Cardiovascular:     Rate and Rhythm: Regular rhythm. Tachycardia present.     Heart sounds: Normal heart sounds.  Pulmonary:     Effort: Pulmonary effort is normal.     Breath sounds: Normal breath sounds.  Skin:    General: Skin is warm and dry.  Neurological:     Mental Status: She is alert and oriented to person, place, and time.  Psychiatric:        Mood and Affect: Mood normal.        Behavior: Behavior normal.        Thought Content: Thought content normal.        Judgment: Judgment normal.      Assessment and Plan   1. Acute bacterial rhinosinusitis - azithromycin (ZITHROMAX) 250 MG tablet; Take 2 tablets today, then 1 tablet by mouth for 4 days  Dispense: 6 tablet; Refill: 0  2. Acute cough - predniSONE (DELTASONE) 10 MG tablet; Take 3 tablets for 3 days, then 2 tablets for 3 days, then 1 tablet for 3 days  Dispense: 18 tablet; Refill: 0 - benzonatate (TESSALON) 100 MG capsule; Take 1 capsule (100 mg total) by mouth 2 (two) times daily as needed for cough.  Dispense: 20 capsule; Refill: 0 - albuterol (PROVENTIL) (2.5 MG/3ML) 0.083% nebulizer solution; Take 3 mLs (2.5 mg total) by  nebulization every 6 (six) hours as needed for wheezing or shortness of breath.  Dispense: 150 mL; Refill: 1 - Novel Coronavirus, NAA ([facility])   Significant maxillary sinus pain and pressure upon palpation.  Will treat for rhinosinusitis.  Cough present for 3.5 weeks, desires relief, appears ill today.  Reports that she is able to stay hydrated, will treat with prednisone taper, benzonatate, and she request a refill for the albuterol nebulizer.  Agrees with plan of care discussed today. Understands warning signs to seek further care: Chest pain, shortness of breath, any significant change in health status. Understands to follow-up in 2 days if significant improvement has not occurred.   , FNP-C 05/04/2020

## 2020-05-06 LAB — SARS-COV-2, NAA 2 DAY TAT

## 2020-05-06 LAB — NOVEL CORONAVIRUS, NAA: SARS-CoV-2, NAA: NOT DETECTED

## 2020-05-06 LAB — SPECIMEN STATUS REPORT

## 2020-05-07 ENCOUNTER — Telehealth: Payer: Self-pay

## 2020-05-07 NOTE — Telephone Encounter (Signed)
Please advise. Thank you

## 2020-05-07 NOTE — Telephone Encounter (Signed)
Patient was seen on 11/29 by Clydie Braun with a bad cough and was told if not better to call back . It was discuss that she could get x-ray  If not better . She is requesting X-ray now. Please advise

## 2020-10-05 ENCOUNTER — Other Ambulatory Visit: Payer: Self-pay | Admitting: Family Medicine

## 2020-10-05 DIAGNOSIS — R051 Acute cough: Secondary | ICD-10-CM

## 2021-04-19 ENCOUNTER — Other Ambulatory Visit: Payer: Self-pay | Admitting: Obstetrics and Gynecology

## 2021-04-19 DIAGNOSIS — R928 Other abnormal and inconclusive findings on diagnostic imaging of breast: Secondary | ICD-10-CM

## 2021-04-20 ENCOUNTER — Ambulatory Visit
Admission: RE | Admit: 2021-04-20 | Discharge: 2021-04-20 | Disposition: A | Discharge status: Home / Self Care | Payer: BC Managed Care – PPO | Source: Ambulatory Visit | Attending: Obstetrics and Gynecology | Admitting: Obstetrics and Gynecology

## 2021-04-20 ENCOUNTER — Other Ambulatory Visit: Payer: Self-pay | Admitting: Obstetrics and Gynecology

## 2021-04-20 ENCOUNTER — Other Ambulatory Visit: Payer: Self-pay

## 2021-04-20 DIAGNOSIS — R928 Other abnormal and inconclusive findings on diagnostic imaging of breast: Secondary | ICD-10-CM

## 2021-04-27 ENCOUNTER — Other Ambulatory Visit: Payer: Self-pay

## 2021-04-27 ENCOUNTER — Ambulatory Visit
Admission: RE | Admit: 2021-04-27 | Discharge: 2021-04-27 | Disposition: A | Discharge status: Home / Self Care | Payer: BC Managed Care – PPO | Source: Ambulatory Visit | Attending: Student | Admitting: Student

## 2021-04-27 VITALS — BP 143/89 | HR 96 | Temp 97.8°F | Resp 14

## 2021-04-27 DIAGNOSIS — J4521 Mild intermittent asthma with (acute) exacerbation: Secondary | ICD-10-CM

## 2021-04-27 MED ORDER — PREDNISONE 20 MG PO TABS: 40.0000 mg | ORAL_TABLET | Freq: Every day | ORAL | 0 refills | Status: AC

## 2021-04-27 NOTE — ED Provider Notes (Signed)
[facility]    CSN: [id number] Arrival date & time: 04/27/21  0920      History   Chief Complaint Chief Complaint  Patient presents with   Cough   Fever   Headache   Nasal Congestion    HPI [name] is a 45 y.o. female presenting with viral URI with cough for about 10 days.  Medical history asthma.  Describes hacking cough productive of clear, white, yellow sputum, some shortness of breath with exertion.  Nasal congestion.  Last fever few days ago.  Has been taking over-the-counter medicines like DayQuil, NyQuil, Robitussin, ibuprofen.  Her nebulizer treatments at home to help, but then the symptoms returned.  HPI  Past Medical History:  Diagnosis Date   Depression    for OCD   Exercise-induced asthma    in high school   GERD (gastroesophageal reflux disease)    Gestational diabetes    IBS (irritable bowel syndrome)    Insomnia    Migraine    "q 3 months maybe" (05/02/2018)   Nausea    PONV (postoperative nausea and vomiting)    Urticaria     Patient Active Problem List   Diagnosis Date Noted   Cough variant asthma 05/04/2020   Acute bacterial rhinosinusitis 05/04/2020   Acute cough 05/04/2020   Cervical radiculopathy 05/01/2018   Allergic reaction 01/30/2018   Angioedema 01/30/2018   Urticaria/angioedema 01/30/2018   Allergic conjunctivitis 01/30/2018   Exercise-induced bronchospasm 01/30/2018   Migraine without aura and without status migrainosus, not intractable 08/08/2017   PMDD (premenstrual dysphoric disorder) 08/08/2017   Analgesic rebound headache 08/08/2017   Perimenopause 08/08/2017   NSVD (normal spontaneous vaginal delivery) 10/28/2015   PIH (pregnancy induced hypertension) 10/27/2015   OCD (obsessive compulsive disorder) 12/19/2014   IBS (irritable bowel syndrome) 08/18/2014   Seasonal and perennial allergic rhinitis 11/24/2013   Depression with anxiety 08/09/2013   Esophageal reflux 11/11/2012   Headache(784.0) 11/11/2012    CONTUSION, ARM 06/28/2007    Past Surgical History:  Procedure Laterality Date   ANTERIOR CERVICAL DECOMP/DISCECTOMY FUSION N/A 05/01/2018   Procedure: Cervical Six-Seven Anterior cervical decompression/discectomy/fusion;  Surgeon: Judith Part, MD;  Location: [place];  Service: Neurosurgery;  Laterality: N/A;  anterior   COLONOSCOPY N/A 09/12/2014   SLF: 1. No obvious source for diarrhea/ abdominal pain identified. IIt's most likely due to IBS-D. 2. Mild diverticulosis in the right colon 3. Moderate sized external hemorrhoids.    EVACUATION OF CERVICAL HEMATOMA N/A 05/01/2018   Procedure: EVACUATION OF CERVICAL HEMATOMA;  Surgeon: Judith Part, MD;  Location: [place];  Service: Neurosurgery;  Laterality: N/A;    OB History     Gravida  5   Para  4   Term  4   Preterm  0   AB  1   Living  4      SAB  1   IAB  0   Ectopic  0   Multiple  0   Live Births  4            Home Medications    Prior to Admission medications   Medication Sig Start Date End Date Taking? Authorizing Provider  predniSONE (DELTASONE) 20 MG tablet Take 2 tablets (40 mg total) by mouth daily for 5 days. Take with breakfast or lunch. Avoid NSAIDs (ibuprofen, etc) while taking this medication. 04/27/21 05/02/21 Yes Hazel Sams, PA-C  albuterol (PROVENTIL) (2.5 MG/3ML) 0.083% nebulizer solution Take 3 mLs (2.5 mg total)  by nebulization every 6 (six) hours as needed for wheezing or shortness of breath. 05/04/20   Chalmers Guest, NP  butalbital-acetaminophen-caffeine (FIORICET, ESGIC) [phone] MG tablet Take 1 tablet by mouth every 4 (four) hours as needed for headache. 02/16/18   Mikey Kirschner, MD  montelukast (SINGULAIR) 10 MG tablet TAKE 1 TABLET BY MOUTH EVERYDAY AT BEDTIME 12/03/18   Mikey Kirschner, MD  Multiple Vitamins-Minerals (ADULT GUMMY) CHEW Chew 2 tablets by mouth 2 (two) times a week.    [provider]  omeprazole (PRILOSEC) 40 MG capsule TAKE ONE DAILY AS  NEEDED FOR ACID REFLUX Patient taking differently: Take 40 mg by mouth daily.  12/13/17   Nilda Simmer, NP  sertraline (ZOLOFT) 100 MG tablet Take 1.5 tablets (150 mg total) by mouth daily. 12/13/17   Nilda Simmer, NP    Family History Family History  Problem Relation Age of Onset   Allergic rhinitis Mother    Asthma Brother    Colon cancer Neg Hx    Eczema Neg Hx    Urticaria Neg Hx     Social History Social History   Tobacco Use   Smoking status: Former    Types: Cigarettes   Smokeless tobacco: Never   Tobacco comments:    smoked "a little" in college  Vaping Use   Vaping Use: Never used  Substance Use Topics   Alcohol use: Yes    Comment: 05/02/2018 "glass/month"   Drug use: Not Currently    Types: Marijuana    Comment: 05/02/2018 "nothing since college"     Allergies   Amoxicillin-pot clavulanate, Cefuroxime axetil, Cephalosporins, Hydrocodone, Levaquin [levofloxacin in d5w], Oxycodone, Temazepam, and Latex   Review of Systems Review of Systems  Constitutional:  Negative for appetite change, chills and fever.  HENT:  Positive for congestion. Negative for ear pain, rhinorrhea, sinus pressure, sinus pain and sore throat.   Eyes:  Negative for redness and visual disturbance.  Respiratory:  Positive for cough and shortness of breath. Negative for chest tightness and wheezing.   Cardiovascular:  Negative for chest pain and palpitations.  Gastrointestinal:  Negative for abdominal pain, constipation, diarrhea, nausea and vomiting.  Genitourinary:  Negative for dysuria, frequency and urgency.  Musculoskeletal:  Negative for myalgias.  Neurological:  Negative for dizziness, weakness and headaches.  Psychiatric/Behavioral:  Negative for confusion.   All other systems reviewed and are negative.   Physical Exam Triage Vital Signs ED Triage Vitals  Enc Vitals Group     BP 04/27/21 0932 (!) 143/89     Pulse Rate 04/27/21 0932 96     Resp 04/27/21 0932 14      Temp 04/27/21 0934 97.8 F (36.6 C)     Temp Source 04/27/21 0934 Oral     SpO2 04/27/21 0932 97 %     Weight --      Height --      Head Circumference --      Peak Flow --      Pain Score 04/27/21 1005 0     Pain Loc --      Pain Edu? --      Excl. in [place]? --    No data found.  Updated Vital Signs BP (!) 143/89 (BP Location: Right Arm)   Pulse 96   Temp 97.8 F (36.6 C) (Oral)   Resp 14   LMP 04/27/2021   SpO2 97%   Visual Acuity Right Eye Distance:   Left Eye Distance:  Bilateral Distance:    Right Eye Near:   Left Eye Near:    Bilateral Near:     Physical Exam Vitals reviewed.  Constitutional:      General: She is not in acute distress.    Appearance: Normal appearance. She is not ill-appearing.  HENT:     Head: Normocephalic and atraumatic.     Right Ear: Tympanic membrane, ear canal and external ear normal. No tenderness. No middle ear effusion. There is no impacted cerumen. Tympanic membrane is not perforated, erythematous, retracted or bulging.     Left Ear: Tympanic membrane, ear canal and external ear normal. No tenderness.  No middle ear effusion. There is no impacted cerumen. Tympanic membrane is not perforated, erythematous, retracted or bulging.     Nose: Nose normal. No congestion.     Mouth/Throat:     Mouth: Mucous membranes are moist.     Pharynx: Uvula midline. No oropharyngeal exudate or posterior oropharyngeal erythema.  Eyes:     Extraocular Movements: Extraocular movements intact.     Pupils: Pupils are equal, round, and reactive to light.  Cardiovascular:     Rate and Rhythm: Normal rate and regular rhythm.     Heart sounds: Normal heart sounds.  Pulmonary:     Effort: Pulmonary effort is normal.     Breath sounds: Normal breath sounds. No decreased breath sounds, wheezing, rhonchi or rales.     Comments: Frequent hacking cough Abdominal:     Palpations: Abdomen is soft.     Tenderness: There is no abdominal tenderness. There is no  guarding or rebound.  Lymphadenopathy:     Cervical: No cervical adenopathy.     Right cervical: No superficial cervical adenopathy.    Left cervical: No superficial cervical adenopathy.  Neurological:     General: No focal deficit present.     Mental Status: She is alert and oriented to person, place, and time.  Psychiatric:        Mood and Affect: Mood normal.        Behavior: Behavior normal.        Thought Content: Thought content normal.        Judgment: Judgment normal.     UC Treatments / Results  Labs (all labs ordered are listed, but only abnormal results are displayed) Labs Reviewed - No data to display  EKG   Radiology No results found.  Procedures Procedures (including critical care time)  Medications Ordered in UC Medications - No data to display  Initial Impression / Assessment and Plan / UC Course  I have reviewed the triage vital signs and the nursing notes.  Pertinent labs & imaging results that were available during my care of the patient were reviewed by me and considered in my medical decision making (see chart for details).     This patient is a very pleasant 45 y.o. year old female presenting with viral URI with cough/ asthma exacerbation. Today this pt is afebrile nontachycardic nontachypneic, oxygenating well on room air, no wheezes rhonchi or rales.   No bacterial infection present. Prednisone sent, continue home nebulizer treatments.   ED return precautions discussed. Patient verbalizes understanding and agreement.   Level 4 for acute exacerbation of chronic condition and prescription drug management.  Final Clinical Impressions(s) / UC Diagnoses   Final diagnoses:  Mild intermittent asthma with acute exacerbation     Discharge Instructions      -You have bronchitis/asthma flare. Bronchitis is an inflammation of the lining  of your bronchial tubes, which carry air to and from your lungs. This typically occurs after a virus, like a  cold virus. People who have bronchitis often cough up thickened mucus, which can be discolored. This isn't a bacterial infection, so you don't need antibiotics. We treat it with medications to help reduce inflammation and open up your lungs.  -Prednisone, 2 pills taken at the same time for 5 days in a row.  Try taking this earlier in the day as it can give you energy. Avoid NSAIDs like ibuprofen and alleve while taking this medication as they can increase your risk of stomach upset and even GI bleeding when in combination with a steroid. You can continue tylenol (acetaminophen) up to 1000mg  3x daily. -Follow-up if symptoms getting worse instead of better, like fever/chills, shortness of breath, coughing up brown or red, etc.    ED Prescriptions     Medication Sig Dispense Auth. Provider   predniSONE (DELTASONE) 20 MG tablet Take 2 tablets (40 mg total) by mouth daily for 5 days. Take with breakfast or lunch. Avoid NSAIDs (ibuprofen, etc) while taking this medication. 10 tablet , PA-C      PDMP not reviewed this encounter.   Rhys Martini, PA-C 04/27/21 1027

## 2021-04-27 NOTE — Discharge Instructions (Addendum)
-  You have bronchitis/asthma flare. Bronchitis is an inflammation of the lining of your bronchial tubes, which carry air to and from your lungs. This typically occurs after a virus, like a cold virus. People who have bronchitis often cough up thickened mucus, which can be discolored. This isn't a bacterial infection, so you don't need antibiotics. We treat it with medications to help reduce inflammation and open up your lungs.  -Prednisone, 2 pills taken at the same time for 5 days in a row.  Try taking this earlier in the day as it can give you energy. Avoid NSAIDs like ibuprofen and alleve while taking this medication as they can increase your risk of stomach upset and even GI bleeding when in combination with a steroid. You can continue tylenol (acetaminophen) up to 1000mg  3x daily. -Follow-up if symptoms getting worse instead of better, like fever/chills, shortness of breath, coughing up brown or red, etc.

## 2021-04-27 NOTE — ED Triage Notes (Signed)
Patient presents to Urgent Care with complaints of headache, SOB, cough, runny nose x 11 days. Treating symptoms with dayquil, nyquil, neb tx, rubuttusin, iburprofen.   Denies fever.

## 2021-05-03 ENCOUNTER — Other Ambulatory Visit: Payer: Self-pay

## 2021-05-03 ENCOUNTER — Ambulatory Visit
Admission: RE | Admit: 2021-05-03 | Discharge: 2021-05-03 | Disposition: A | Discharge status: Home / Self Care | Payer: BC Managed Care – PPO | Source: Ambulatory Visit | Attending: Obstetrics and Gynecology | Admitting: Obstetrics and Gynecology

## 2021-05-03 ENCOUNTER — Other Ambulatory Visit: Payer: Self-pay | Admitting: Obstetrics and Gynecology

## 2021-05-03 ENCOUNTER — Other Ambulatory Visit (HOSPITAL_COMMUNITY): Payer: Self-pay | Admitting: Diagnostic Radiology

## 2021-05-03 DIAGNOSIS — R928 Other abnormal and inconclusive findings on diagnostic imaging of breast: Secondary | ICD-10-CM

## 2021-05-05 ENCOUNTER — Other Ambulatory Visit: Payer: Self-pay | Admitting: Diagnostic Radiology

## 2021-05-05 DIAGNOSIS — R921 Mammographic calcification found on diagnostic imaging of breast: Secondary | ICD-10-CM

## 2021-05-12 ENCOUNTER — Other Ambulatory Visit: Payer: Self-pay | Admitting: Obstetrics and Gynecology

## 2021-05-12 DIAGNOSIS — R921 Mammographic calcification found on diagnostic imaging of breast: Secondary | ICD-10-CM

## 2021-05-17 ENCOUNTER — Ambulatory Visit
Admission: RE | Admit: 2021-05-17 | Discharge: 2021-05-17 | Disposition: A | Discharge status: Home / Self Care | Payer: BC Managed Care – PPO | Source: Ambulatory Visit | Attending: Obstetrics and Gynecology | Admitting: Obstetrics and Gynecology

## 2021-05-17 ENCOUNTER — Ambulatory Visit
Admission: RE | Admit: 2021-05-17 | Discharge: 2021-05-17 | Disposition: A | Discharge status: Home / Self Care | Payer: BC Managed Care – PPO | Source: Ambulatory Visit | Attending: Diagnostic Radiology | Admitting: Diagnostic Radiology

## 2021-05-17 ENCOUNTER — Other Ambulatory Visit (HOSPITAL_COMMUNITY): Payer: Self-pay | Admitting: Diagnostic Radiology

## 2021-05-17 DIAGNOSIS — R921 Mammographic calcification found on diagnostic imaging of breast: Secondary | ICD-10-CM

## 2021-06-16 ENCOUNTER — Telehealth: Payer: Self-pay

## 2021-06-16 NOTE — Telephone Encounter (Signed)
NOTES SCANNED TO REFERRAL 

## 2021-06-30 ENCOUNTER — Encounter: Payer: Self-pay | Admitting: Internal Medicine

## 2021-08-13 ENCOUNTER — Other Ambulatory Visit: Payer: Self-pay | Admitting: Neurological Surgery

## 2021-08-13 ENCOUNTER — Other Ambulatory Visit (HOSPITAL_COMMUNITY): Payer: Self-pay | Admitting: Neurological Surgery

## 2021-08-13 DIAGNOSIS — M5412 Radiculopathy, cervical region: Secondary | ICD-10-CM

## 2021-08-19 ENCOUNTER — Other Ambulatory Visit: Payer: Self-pay

## 2021-08-19 ENCOUNTER — Ambulatory Visit (HOSPITAL_COMMUNITY)
Admission: RE | Admit: 2021-08-19 | Discharge: 2021-08-19 | Disposition: A | Discharge status: Home / Self Care | Payer: BC Managed Care – PPO | Source: Ambulatory Visit | Attending: Neurological Surgery | Admitting: Neurological Surgery

## 2021-08-19 DIAGNOSIS — M5412 Radiculopathy, cervical region: Secondary | ICD-10-CM | POA: Diagnosis not present

## 2021-08-30 ENCOUNTER — Ambulatory Visit (HOSPITAL_COMMUNITY): Payer: BC Managed Care – PPO

## 2021-08-30 ENCOUNTER — Encounter (HOSPITAL_COMMUNITY): Payer: Self-pay

## 2021-08-31 ENCOUNTER — Other Ambulatory Visit: Payer: Self-pay | Admitting: Neurological Surgery

## 2021-08-31 DIAGNOSIS — M5412 Radiculopathy, cervical region: Secondary | ICD-10-CM

## 2021-09-10 ENCOUNTER — Ambulatory Visit
Admission: RE | Admit: 2021-09-10 | Discharge: 2021-09-10 | Disposition: A | Discharge status: Home / Self Care | Payer: BC Managed Care – PPO | Source: Ambulatory Visit | Attending: Neurological Surgery | Admitting: Neurological Surgery

## 2021-09-10 DIAGNOSIS — M5412 Radiculopathy, cervical region: Secondary | ICD-10-CM

## 2021-10-13 ENCOUNTER — Ambulatory Visit: Payer: Self-pay

## 2022-12-02 IMAGING — CT CT CERVICAL SPINE W/O CM
3 series · 13 of 33 positions shown, 16 images · non-contrast
Comparison: Cervical MRI 08/19/2021

CLINICAL DATA: Right radiculopathy



[Series 3: c_spine 2.0 st · axial · 0.27mm/px · z∈[-261,-135]mm · 5 of 92 slices shown, 7 images]
[im 15/92  soft-tissue]
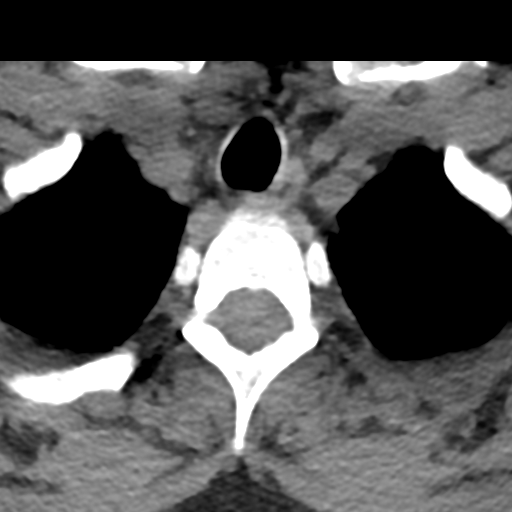
[im 15/92  bone]
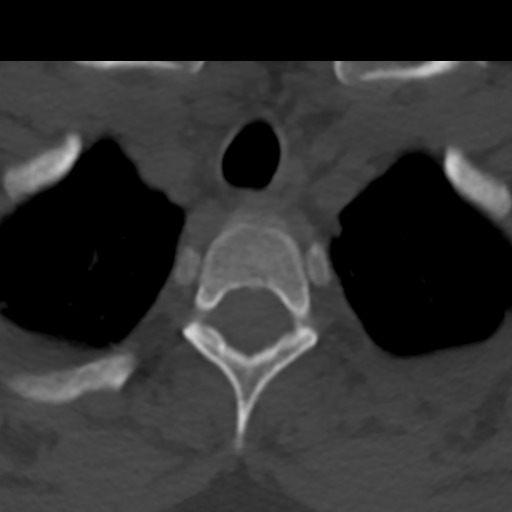
[im 29/92  bone]
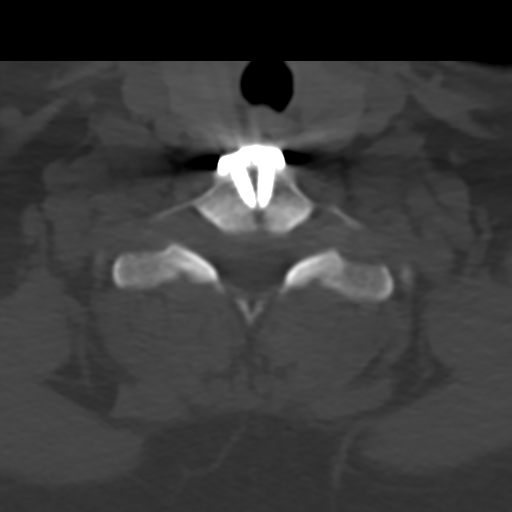
[im 50/92  bone]
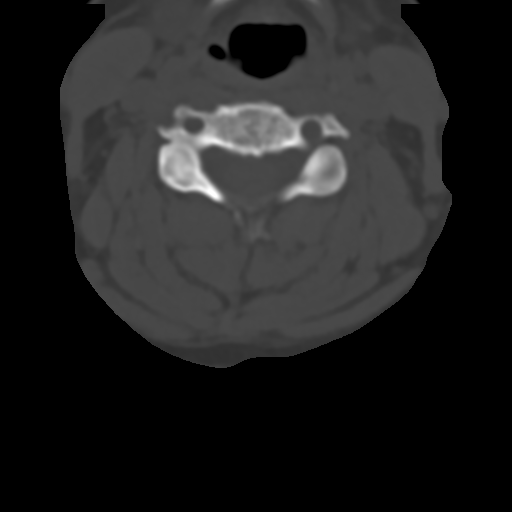
[im 64/92  bone]
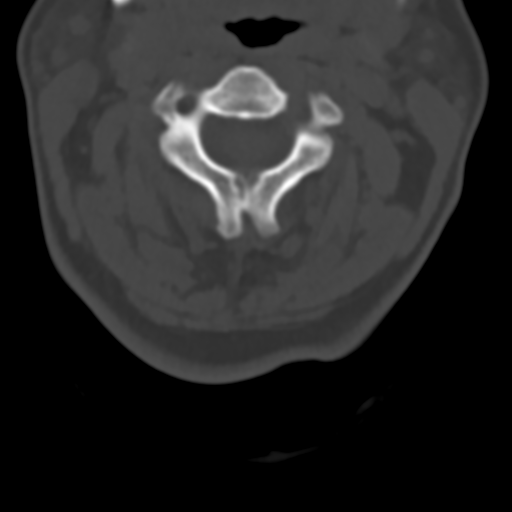
[im 78/92  soft-tissue]
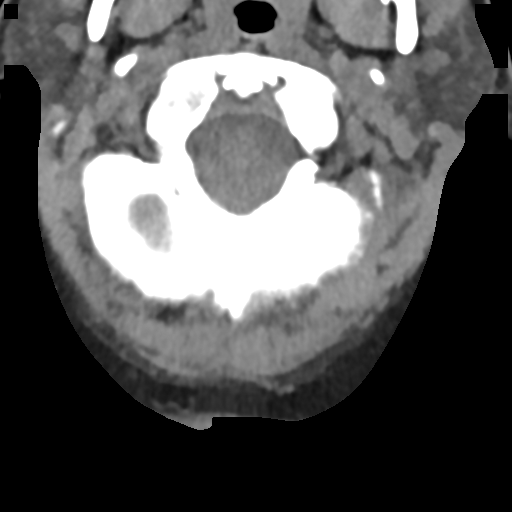
[im 78/92  bone]
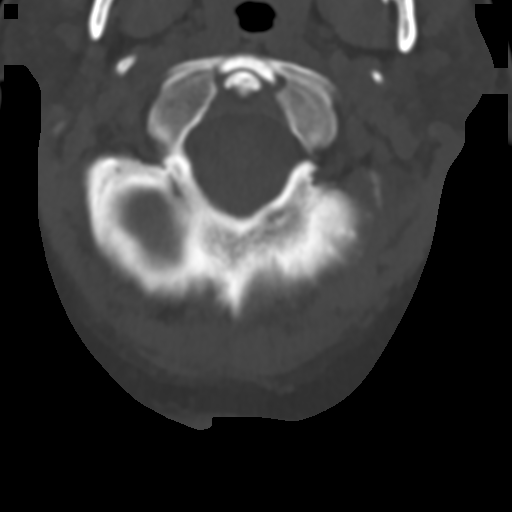

[Series 7: c_spine 2.0 sag bone · sagittal · 0.27mm/px · 5 of 62 slices shown, 6 images]
[im 21/62  bone]
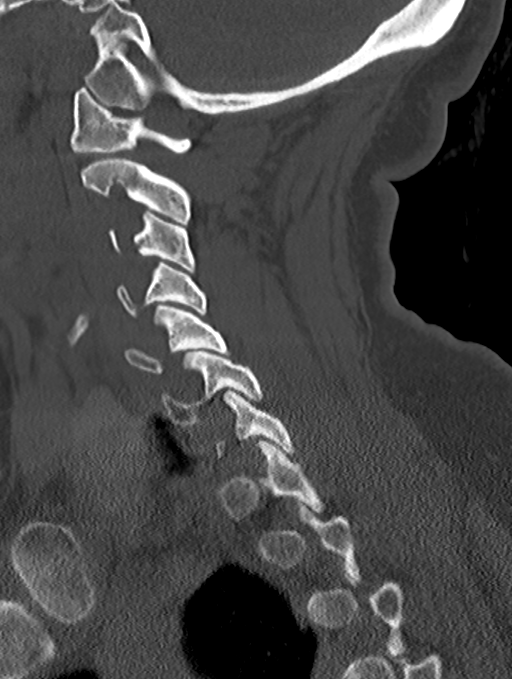
[im 26/62  bone]
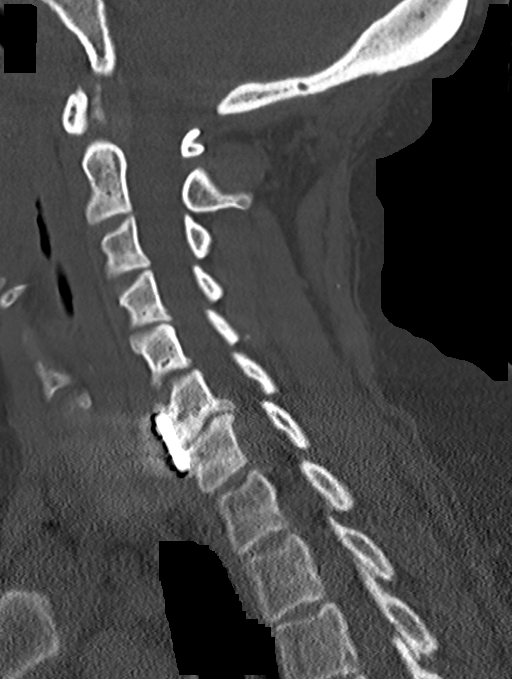
[im 31/62  soft-tissue]
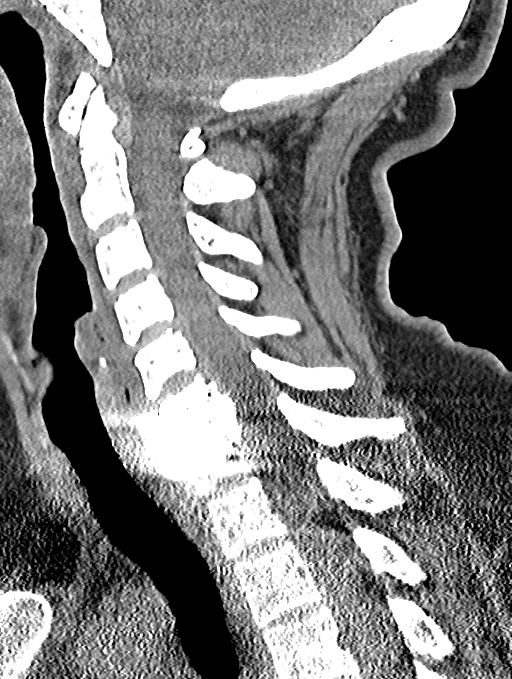
[im 31/62  bone]
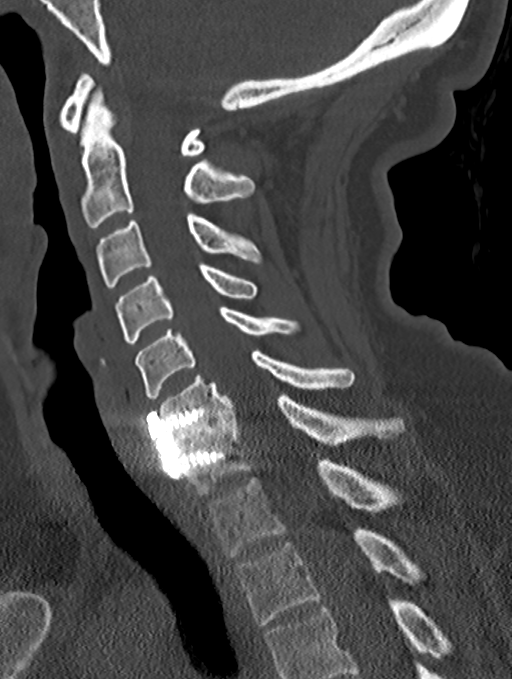
[im 36/62  bone]
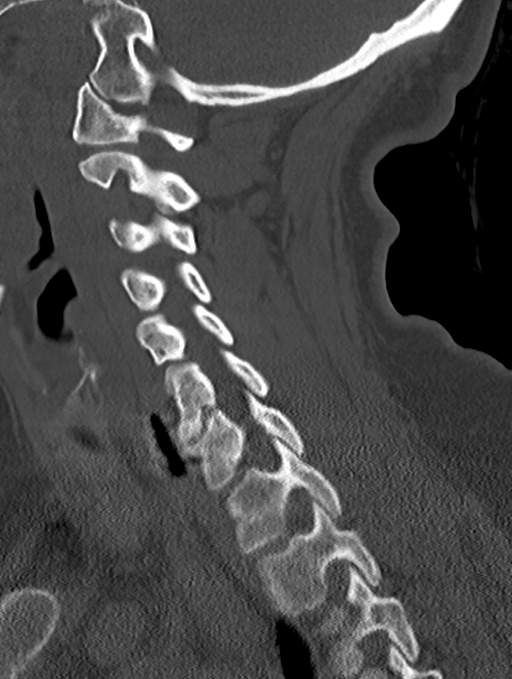
[im 41/62  bone]
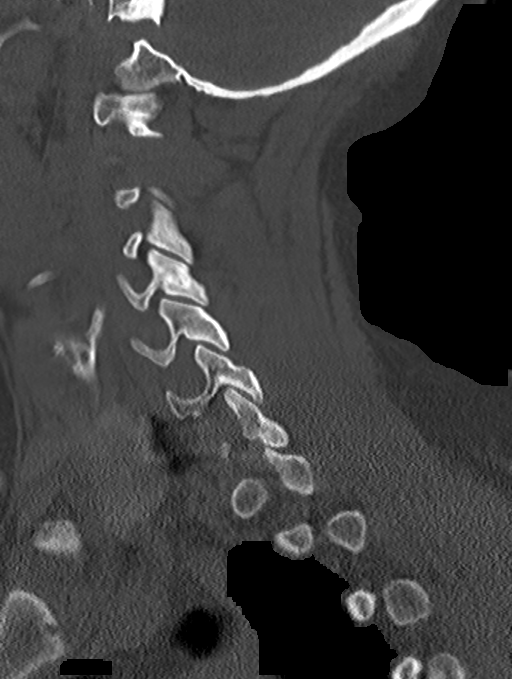

[Series 8: c_spine 2.0 cor bone · coronal · 0.27mm/px · 3 of 62 slices shown]
[im 13/62  bone]
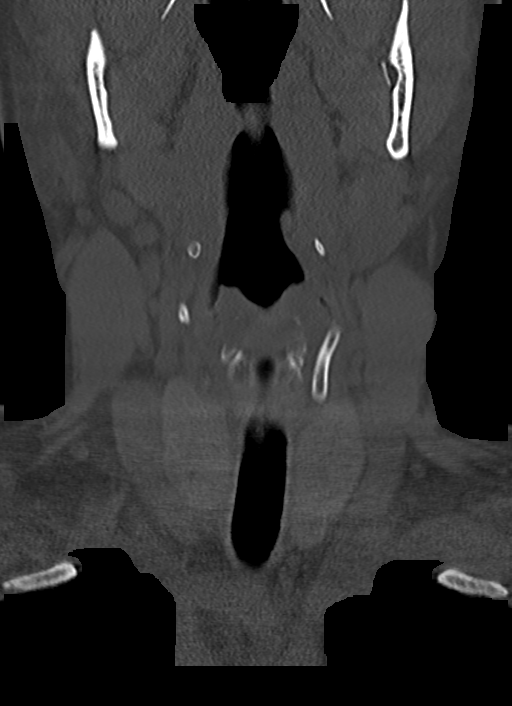
[im 25/62  bone]
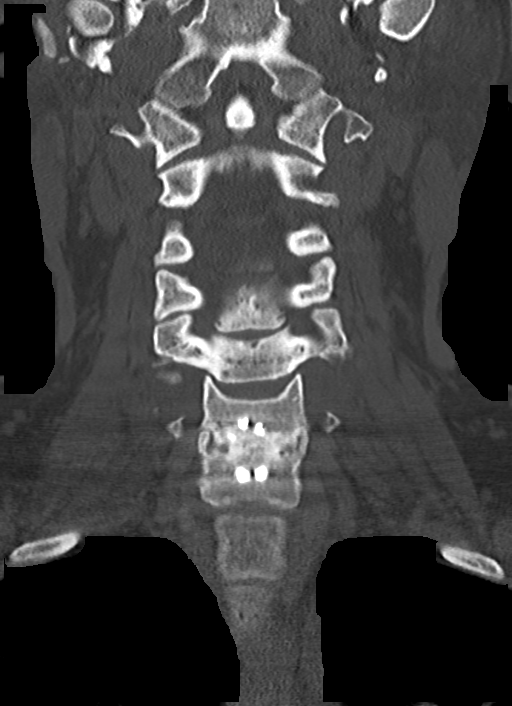
[im 37/62  bone]
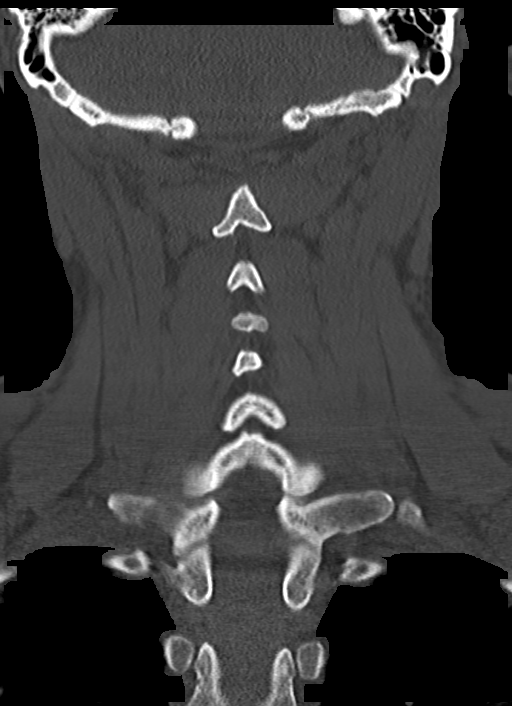

[13 of 33 positions shown; findings below may reference images not displayed]

FINDINGS: Alignment: Straightening of the cervical spine

Skull base and vertebrae: No acute fracture. No primary bone lesion
or focal pathologic process. C5-6 scratched at C6-7 ACDF with solid
arthrodesis

Soft tissues and spinal canal: No evidence of perispinal mass or
inflammation

Disc levels:

C3-4: Small central disc protrusion.  No neural compression

C4-5: Small central disc protrusion.  No neural compression

C5-6: Small central disc protrusion.  No neural compression

C6-7: ACDF with solid arthrodesis. Right eccentric endplate ridging
with patent foramina.

C7-T1:Unremarkable.

Upper chest: Negative
IMPRESSION: 1. C6-7 ACDF with solid arthrodesis. Right paracentral ridging
indenting the ventral thecal sac by prior MRI. No bony foraminal
impingement.
2. Small noncompressive disc protrusions at C3-4 to C5-6

## 2023-09-15 ENCOUNTER — Telehealth: Payer: Self-pay | Admitting: Internal Medicine

## 2023-09-15 NOTE — Telephone Encounter (Signed)
 Okay to schedule for direct colonoscopy in [facility].  Colonoscopy 09/12/14: Good prep. Mild diverticulosis in the right colon. Moderate sized external hemorrhoids. No obvious source for diarrhea/abdominal pain identified.

## 2023-09-15 NOTE — Telephone Encounter (Signed)
 Good morning Dr. Leonides Schanz,   DOD 4/11 AM  We received a referral for patient to have a colonoscopy. Patient last had a colonoscopy in 2016 with [facility]. Patient is requesting to transfer her care due to her provider leaving the practice and was referred to our practice by her gynecologist. Patient's records are in Epic under procedures for you to review and advise on scheduling.   Thank you.

## 2023-09-20 NOTE — Telephone Encounter (Signed)
 Called patient to schedule. Left voicemail

## 2024-05-01 ENCOUNTER — Other Ambulatory Visit: Payer: Self-pay

## 2024-05-01 ENCOUNTER — Encounter (HOSPITAL_COMMUNITY): Payer: Self-pay | Admitting: Emergency Medicine

## 2024-05-01 ENCOUNTER — Emergency Department (HOSPITAL_COMMUNITY)
Admission: EM | Admit: 2024-05-01 | Discharge: 2024-05-01 | Disposition: A | Discharge status: Home / Self Care | Attending: Emergency Medicine | Admitting: Emergency Medicine

## 2024-05-01 ENCOUNTER — Emergency Department (HOSPITAL_COMMUNITY)

## 2024-05-01 DIAGNOSIS — Z91040 Latex allergy status: Secondary | ICD-10-CM | POA: Diagnosis not present

## 2024-05-01 DIAGNOSIS — R42 Dizziness and giddiness: Secondary | ICD-10-CM | POA: Diagnosis present

## 2024-05-01 DIAGNOSIS — Z79899 Other long term (current) drug therapy: Secondary | ICD-10-CM | POA: Diagnosis not present

## 2024-05-01 DIAGNOSIS — R197 Diarrhea, unspecified: Secondary | ICD-10-CM | POA: Insufficient documentation

## 2024-05-01 DIAGNOSIS — R112 Nausea with vomiting, unspecified: Secondary | ICD-10-CM | POA: Insufficient documentation

## 2024-05-01 LAB — URINALYSIS, ROUTINE W REFLEX MICROSCOPIC
Bilirubin Urine: NEGATIVE
Glucose, UA: NEGATIVE mg/dL
Hgb urine dipstick: NEGATIVE
Ketones, ur: NEGATIVE mg/dL
Nitrite: NEGATIVE
Protein, ur: NEGATIVE mg/dL
Specific Gravity, Urine: 1.02 (ref 1.005–1.030)
pH: 6 (ref 5.0–8.0)

## 2024-05-01 LAB — COMPREHENSIVE METABOLIC PANEL WITH GFR
ALT: 15 U/L (ref 0–44)
AST: 22 U/L (ref 15–41)
Albumin: 4.3 g/dL (ref 3.5–5.0)
Alkaline Phosphatase: 79 U/L (ref 38–126)
Anion gap: 10 (ref 5–15)
BUN: 7 mg/dL (ref 6–20)
CO2: 24 mmol/L (ref 22–32)
Calcium: 8.7 mg/dL — ABNORMAL LOW (ref 8.9–10.3)
Chloride: 105 mmol/L (ref 98–111)
Creatinine, Ser: 0.77 mg/dL (ref 0.44–1.00)
GFR, Estimated: >60 mL/min (ref >60)
Glucose, Bld: 108 mg/dL — ABNORMAL HIGH (ref 70–99)
Potassium: 3.6 mmol/L (ref 3.5–5.1)
Sodium: 139 mmol/L (ref 135–145)
Total Bilirubin: <0.2 mg/dL (ref 0.0–1.2)
Total Protein: 6.9 g/dL (ref 6.5–8.1)

## 2024-05-01 LAB — LIPASE, BLOOD: Lipase: 26 U/L (ref 11–51)

## 2024-05-01 LAB — CBC
HCT: 38.1 % (ref 36.0–46.0)
Hemoglobin: 11.7 g/dL — ABNORMAL LOW (ref 12.0–15.0)
MCH: 24.7 pg — ABNORMAL LOW (ref 26.0–34.0)
MCHC: 30.7 g/dL (ref 30.0–36.0)
MCV: 80.5 fL (ref 80.0–100.0)
Platelets: 354 K/uL (ref 150–400)
RBC: 4.73 MIL/uL (ref 3.87–5.11)
RDW: 15 % (ref 11.5–15.5)
WBC: 10.1 K/uL (ref 4.0–10.5)
nRBC: 0 % (ref 0.0–0.2)

## 2024-05-01 LAB — CBG MONITORING, ED: Glucose-Capillary: 115 mg/dL — ABNORMAL HIGH (ref 70–99)

## 2024-05-01 LAB — TROPONIN T, HIGH SENSITIVITY: Troponin T High Sensitivity: <15 ng/L (ref 0–19)

## 2024-05-01 MED ORDER — MECLIZINE HCL 25 MG PO TABS: 25.0000 mg | ORAL_TABLET | Freq: Three times a day (TID) | ORAL | 0 refills | Status: DC | PRN

## 2024-05-01 MED ORDER — ONDANSETRON HCL 4 MG/2ML IJ SOLN
4.0000 mg | Freq: Once | INTRAMUSCULAR | Status: AC
Start: 2024-05-01 — End: 2024-05-01
  Administered 2024-05-01: 4 mg via INTRAVENOUS

## 2024-05-01 MED ORDER — ONDANSETRON HCL 4 MG PO TABS: 4.0000 mg | ORAL_TABLET | Freq: Four times a day (QID) | ORAL | 0 refills | Status: DC

## 2024-05-01 MED ORDER — ONDANSETRON HCL 4 MG/2ML IJ SOLN
4.0000 mg | Freq: Once | INTRAMUSCULAR | Status: DC
  Filled 2024-05-01: qty 2

## 2024-05-01 NOTE — ED Provider Notes (Signed)
 [place] EMERGENCY DEPARTMENT AT [facility] Provider Note   CSN: [id number] Arrival date & time: 05/01/24  1423     Patient presents with: Dizziness   [name] is a 48 y.o. female.    Dizziness    This patient is a 48 year old female, she has a history of acid reflux, some anxiety depression, she takes sertraline  and omeprazole  as well as Singulair , she has been on these medications for quite some time.  She reports that this morning she opened her eyes and almost immediately felt like the room was spinning, felt like she could not walk straight, felt like she was extremely nauseated and has vomited multiple times with lots of dry heaving and has also had diarrhea.  She feels like this is related to an avocado salad with an avocado dressing that may have been the cause of her symptoms.  She has no fevers or chills, she has no abdominal pain chest pain or shortness of breath, she has no palpitations.  She does not feel weak or numb in any of her arms or legs and has no changes in her vision.  She did have a ringing in her right ear earlier in the day.  She states that her symptoms are much better than they were but they are still persistent with a slight vertigo feeling and persistent nausea.  Prior to Admission medications   Medication Sig Start Date End Date Taking? Authorizing Provider  ascorbic acid (VITAMIN C) 500 MG tablet Take 500 mg by mouth daily.   Yes [provider]  butalbital -acetaminophen -caffeine  (FIORICET, ESGIC) 50-325-40 MG tablet Take 1 tablet by mouth every 4 (four) hours as needed for headache. 02/16/18  Yes Alphonsa Elsie RAMAN, MD  meclizine  (ANTIVERT ) 25 MG tablet Take 1 tablet (25 mg total) by mouth 3 (three) times daily as needed for dizziness. 05/01/24  Yes Cleotilde Rogue, MD  montelukast  (SINGULAIR ) 10 MG tablet TAKE 1 TABLET BY MOUTH EVERYDAY AT BEDTIME 12/03/18  Yes Alphonsa Elsie RAMAN, MD  Multiple Vitamins-Minerals (ADULT GUMMY) CHEW Chew  2 tablets by mouth daily.   Yes [provider]  Multiple Vitamins-Minerals (HAIR SKIN & NAILS PO) Take 2 tablets by mouth daily. Biotin and collagen gummies   Yes [provider]  omeprazole  (PRILOSEC) 40 MG capsule TAKE ONE DAILY AS NEEDED FOR ACID REFLUX Patient taking differently: Take 40 mg by mouth daily. 12/13/17  Yes Mauro Elveria JAYSON, NP  ondansetron  (ZOFRAN ) 4 MG tablet Take 1 tablet (4 mg total) by mouth every 6 (six) hours. 05/01/24  Yes Cleotilde Rogue, MD  sertraline  (ZOLOFT ) 100 MG tablet Take 1.5 tablets (150 mg total) by mouth daily. 12/13/17  Yes Mauro Elveria JAYSON, NP    Allergies: Amoxicillin -pot clavulanate, Cefuroxime axetil, Latex, Levaquin [levofloxacin in d5w], Oxycodone , Temazepam , Cephalosporins, and Hydrocodone     Review of Systems  Neurological:  Positive for dizziness.  All other systems reviewed and are negative.   Updated Vital Signs BP 117/71   Pulse 89   Temp 98.7 F (37.1 C) (Oral)   Resp 18   Ht 1.651 m (5' 5)   Wt 83.9 kg   SpO2 97%   BMI 30.79 kg/m   Physical Exam Vitals and nursing note reviewed.  Constitutional:      General: She is not in acute distress.    Appearance: She is well-developed.  HENT:     Head: Normocephalic and atraumatic.     Mouth/Throat:     Pharynx: No oropharyngeal  exudate.  Eyes:     General: No scleral icterus.       Right eye: No discharge.        Left eye: No discharge.     Conjunctiva/sclera: Conjunctivae normal.     Pupils: Pupils are equal, round, and reactive to light.  Neck:     Thyroid: No thyromegaly.     Vascular: No JVD.  Cardiovascular:     Rate and Rhythm: Normal rate and regular rhythm.     Heart sounds: Normal heart sounds. No murmur heard.    No friction rub. No gallop.  Pulmonary:     Effort: Pulmonary effort is normal. No respiratory distress.     Breath sounds: Normal breath sounds. No wheezing or rales.  Abdominal:     General: Bowel sounds are normal. There is no  distension.     Palpations: Abdomen is soft. There is no mass.     Tenderness: There is no abdominal tenderness.  Musculoskeletal:        General: No tenderness. Normal range of motion.     Cervical back: Normal range of motion and neck supple.     Right lower leg: No edema.     Left lower leg: No edema.  Lymphadenopathy:     Cervical: No cervical adenopathy.  Skin:    General: Skin is warm and dry.     Findings: No erythema or rash.  Neurological:     Mental Status: She is alert.     Coordination: Coordination normal.     Comments: Speech is clear, cranial nerves III through XII are intact, memory is intact, strength is normal in all 4 extremities including grips and strength at the bilateral thighs, knees and ankles to extention and flexion, sensation is intact to light touch and pinprick in all 4 extremities. Coordination as tested by finger-nose-finger is normal, no limb ataxia. Normal gait, normal reflexes at the patellar tendons bilaterally  Psychiatric:        Behavior: Behavior normal.     (all labs ordered are listed, but only abnormal results are displayed) Labs Reviewed  COMPREHENSIVE METABOLIC PANEL WITH GFR - Abnormal; Notable for the following components:      Result Value   Glucose, Bld 108 (*)    Calcium 8.7 (*)    All other components within normal limits  CBC - Abnormal; Notable for the following components:   Hemoglobin 11.7 (*)    MCH 24.7 (*)    All other components within normal limits  CBG MONITORING, ED - Abnormal; Notable for the following components:   Glucose-Capillary 115 (*)    All other components within normal limits  LIPASE, BLOOD  URINALYSIS, ROUTINE W REFLEX MICROSCOPIC  TROPONIN T, HIGH SENSITIVITY    EKG: EKG Interpretation Date/Time:  Wednesday May 01 2024 14:36:27 EST Ventricular Rate:  87 PR Interval:  158 QRS Duration:  82 QT Interval:  402 QTC Calculation: 484 R Axis:   77  Text Interpretation: Sinus rhythm Borderline T  abnormalities, anterior leads No old tracing to compare Confirmed by Cleotilde Rogue ([id number]) on 05/01/2024 2:50:01 PM  Radiology: CT Head Wo Contrast Result Date: 05/01/2024 EXAM: CT HEAD WITHOUT 05/01/2024 03:58:44 PM TECHNIQUE: CT of the head was performed without the administration of intravenous contrast. Automated exposure control, iterative reconstruction, and/or weight based adjustment of the mA/kV was utilized to reduce the radiation dose to as low as reasonably achievable. COMPARISON: None available. CLINICAL HISTORY: Vertigo, central. FINDINGS: BRAIN AND VENTRICLES: No  acute intracranial hemorrhage. No mass effect or midline shift. No extra-axial fluid collection. No evidence of acute infarct. No hydrocephalus. ORBITS: No acute abnormality. SINUSES AND MASTOIDS: No acute abnormality. SOFT TISSUES AND SKULL: No acute skull fracture. No acute soft tissue abnormality. IMPRESSION: 1. No acute intracranial abnormality. Electronically signed by: Donnice Mania MD 05/01/2024 05:18 PM EST RP Workstation: [id number]   DG Chest Port 1 View Result Date: 05/01/2024 EXAM: 1 VIEW(S) XRAY OF THE CHEST 05/01/2024 03:59:00 PM COMPARISON: None available. CLINICAL HISTORY: abnromal EKG FINDINGS: LUNGS AND PLEURA: No focal pulmonary opacity. No pleural effusion. No pneumothorax. HEART AND MEDIASTINUM: No acute abnormality of the cardiac and mediastinal silhouettes. BONES AND SOFT TISSUES: Postoperative changes in the cervical spine. IMPRESSION: 1. No acute cardiopulmonary process. Electronically signed by: Elsie Gravely MD 05/01/2024 05:12 PM EST RP Workstation: [id number]     Procedures   Medications Ordered in the ED  ondansetron  (ZOFRAN ) injection 4 mg (4 mg Intravenous Not Given 05/01/24 1607)  ondansetron  (ZOFRAN ) injection 4 mg (4 mg Intravenous Given 05/01/24 1608)                                    Medical Decision Making Amount and/or Complexity of Data Reviewed Labs: ordered. Radiology:  ordered.  Risk Prescription drug management.    This patient presents to the ED for concern of nausea and dizziness, this involves an extensive number of treatment options, and is a complaint that carries with it a high risk of complications and morbidity.  The differential diagnosis includes peripheral vertigo, much less likely to be central vertigo given the acute onset and improvement with position.  That being said I cannot induce the nystagmus or the severe vertigo when I have her move around.  The presence of diarrhea makes me think less likely that this is related to a central cause as well   Co morbidities / Chronic conditions that complicate the patient evaluation  No significant chronic medical conditions or comorbidities   Additional history obtained:  Additional history obtained from EMR External records from outside source obtained and reviewed including medical record, the patient has not had any recent evaluations in the hospital.   Lab Tests:  I Ordered, and personally interpreted labs.  The pertinent results include: Labs unremarkable including CBC metabolic panel and lipase   Imaging Studies ordered:  I ordered imaging studies including CT scan of the head I independently visualized and interpreted imaging which showed no signs of masses tumors infarctions or bleeding I agree with the radiologist interpretation   Cardiac Monitoring: / EKG:  The patient was maintained on a cardiac monitor.  I personally viewed and interpreted the cardiac monitored which showed an underlying rhythm of: Normal sinus rhythm   Problem List / ED Course / Critical interventions / Medication management  The patient does have a slightly abnormal EKG that, there is nothing to compare to, the blood work was unremarkable, no electrolyte abnormalities I ordered medication including Zofran  and IV fluids Reevaluation of the patient after these medicines showed that the patient had  complete resolution of all of her symptoms and is totally asymptomatic at the time of reevaluation at 5:30 PM I have reviewed the patients home medicines and have made adjustments as needed    Social Determinants of Health:  None   Test / Admission - Considered:  Considered admission but the patient is asymptomatic and requesting discharge which I  think is totally reasonable.  Whether this was related to more of a food poisoning issue or to an inner ear issue it is unclear as she did have some tinnitus associated with all of this however at this time her vital signs are normal, workup has been unremarkable and she was informed of all of her results, she is agreeable to the plan, home with Zofran  and meclizine       Final diagnoses:  Vertigo  Nausea vomiting and diarrhea    ED Discharge Orders          Ordered    ondansetron  (ZOFRAN ) 4 MG tablet  Every 6 hours        05/01/24 1736    meclizine  (ANTIVERT ) 25 MG tablet  3 times daily PRN        05/01/24 1736               Cleotilde Rogue, MD 05/01/24 1737

## 2024-05-01 NOTE — ED Notes (Addendum)
 Patient transported to CT

## 2024-05-01 NOTE — ED Triage Notes (Signed)
 Pt in by [facility]. Pt states she ate lunch then took a nap. When she woke up she felt as if the room was spinning then she started to vomit multiple times. Also has nausea and diarrhea. States she is still very dizzy.

## 2024-05-01 NOTE — Discharge Instructions (Signed)
 Zofran is a medication which can help with nausea.  You may take 4 mg by mouth every 6 hours as needed if you are an adult, if your child under the age of 6 take half of a tablet or 2 mg every 6 hours as needed.  This should dissolve on your tongue within a short timeframe.  Wait about 30 minutes after taking it to help with drinking clear liquids.  Meclizine is a medication that can help with vertigo and dizziness.  You may take 1 tablet every 6 hours as needed.  Please make sure that you do not drive when you are taking this medication as it can make you sleepy.  Thank you for allowing [place] to treat you in the emergency department today.  After reviewing your examination and potential testing that was done it appears that you are safe to go home.  I would like for you to follow-up with your doctor within the next several days, have them obtain your records and follow-up with them to review all potential tests and results from your visit.  If you should develop severe or worsening symptoms return to the emergency department immediately

## 2024-05-04 ENCOUNTER — Emergency Department (HOSPITAL_COMMUNITY)
Admission: EM | Admit: 2024-05-04 | Discharge: 2024-05-04 | Disposition: A | Discharge status: Home / Self Care | Attending: Emergency Medicine | Admitting: Emergency Medicine

## 2024-05-04 ENCOUNTER — Emergency Department (HOSPITAL_COMMUNITY)

## 2024-05-04 DIAGNOSIS — Z91040 Latex allergy status: Secondary | ICD-10-CM | POA: Diagnosis not present

## 2024-05-04 DIAGNOSIS — R42 Dizziness and giddiness: Secondary | ICD-10-CM | POA: Diagnosis present

## 2024-05-04 LAB — COMPREHENSIVE METABOLIC PANEL WITH GFR
ALT: 14 U/L (ref 0–44)
AST: 18 U/L (ref 15–41)
Albumin: 4.6 g/dL (ref 3.5–5.0)
Alkaline Phosphatase: 83 U/L (ref 38–126)
Anion gap: 14 (ref 5–15)
BUN: 10 mg/dL (ref 6–20)
CO2: 24 mmol/L (ref 22–32)
Calcium: 9.2 mg/dL (ref 8.9–10.3)
Chloride: 104 mmol/L (ref 98–111)
Creatinine, Ser: 0.72 mg/dL (ref 0.44–1.00)
GFR, Estimated: >60 mL/min (ref >60)
Glucose, Bld: 93 mg/dL (ref 70–99)
Potassium: 3.5 mmol/L (ref 3.5–5.1)
Sodium: 141 mmol/L (ref 135–145)
Total Bilirubin: 0.3 mg/dL (ref 0.0–1.2)
Total Protein: 7.4 g/dL (ref 6.5–8.1)

## 2024-05-04 LAB — CBC WITH DIFFERENTIAL/PLATELET
Abs Immature Granulocytes: 0.04 K/uL (ref 0.00–0.07)
Basophils Absolute: 0.1 K/uL (ref 0.0–0.1)
Basophils Relative: 1 %
Eosinophils Absolute: 0.3 K/uL (ref 0.0–0.5)
Eosinophils Relative: 3 %
HCT: 41.1 % (ref 36.0–46.0)
Hemoglobin: 12.6 g/dL (ref 12.0–15.0)
Immature Granulocytes: 0 %
Lymphocytes Relative: 25 %
Lymphs Abs: 2.4 K/uL (ref 0.7–4.0)
MCH: 24.4 pg — ABNORMAL LOW (ref 26.0–34.0)
MCHC: 30.7 g/dL (ref 30.0–36.0)
MCV: 79.7 fL — ABNORMAL LOW (ref 80.0–100.0)
Monocytes Absolute: 0.8 K/uL (ref 0.1–1.0)
Monocytes Relative: 9 %
Neutro Abs: 5.9 K/uL (ref 1.7–7.7)
Neutrophils Relative %: 62 %
Platelets: 355 K/uL (ref 150–400)
RBC: 5.16 MIL/uL — ABNORMAL HIGH (ref 3.87–5.11)
RDW: 14.9 % (ref 11.5–15.5)
WBC: 9.5 K/uL (ref 4.0–10.5)
nRBC: 0 % (ref 0.0–0.2)

## 2024-05-04 MED ORDER — ONDANSETRON HCL 4 MG/2ML IJ SOLN
4.0000 mg | Freq: Once | INTRAMUSCULAR | Status: AC
  Administered 2024-05-04: 4 mg via INTRAVENOUS
  Filled 2024-05-04: qty 2

## 2024-05-04 MED ORDER — ONDANSETRON HCL 4 MG PO TABS: 4.0000 mg | ORAL_TABLET | Freq: Four times a day (QID) | ORAL | 0 refills | Status: AC

## 2024-05-04 MED ORDER — DIAZEPAM 5 MG PO TABS
5.0000 mg | ORAL_TABLET | Freq: Once | ORAL | Status: AC
  Administered 2024-05-04: 5 mg via ORAL
  Filled 2024-05-04: qty 1

## 2024-05-04 MED ORDER — DEXAMETHASONE SOD PHOSPHATE PF 10 MG/ML IJ SOLN
10.0000 mg | Freq: Once | INTRAMUSCULAR | Status: AC
  Administered 2024-05-04: 10 mg via INTRAVENOUS

## 2024-05-04 MED ORDER — SODIUM CHLORIDE 0.9 % IV BOLUS
1000.0000 mL | Freq: Once | INTRAVENOUS | Status: AC
  Administered 2024-05-04: 1000 mL via INTRAVENOUS

## 2024-05-04 NOTE — Discharge Instructions (Signed)
 Zofran  is a medication which can help with nausea.  You may take 4 mg by mouth every 6 hours as needed if you are an adult, if your child under the age of 6 take half of a tablet or 2 mg every 6 hours as needed.  This should dissolve on your tongue within a short timeframe.  Wait about 30 minutes after taking it to help with drinking clear liquids.  Your MRI was normal thankfully showing no signs of tumors or other strokes or problems with your brain  Thank you for allowing us  to treat you in the emergency department today.  After reviewing your examination and potential testing that was done it appears that you are safe to go home.  I would like for you to follow-up with your doctor within the next several days, have them obtain your records and follow-up with them to review all potential tests and results from your visit.  If you should develop severe or worsening symptoms return to the emergency department immediately  I gave you the phone number for the ear nose and throat doctor above, you can see the doctor of your choice or use this phone number to follow-up.

## 2024-05-04 NOTE — ED Triage Notes (Signed)
 Pt comes in for similar s/s of pt was here on Wednesday. Pt still has n/v and dizziness. A&Ox4  Provider is in the room

## 2024-05-04 NOTE — ED Provider Notes (Signed)
 [place] EMERGENCY DEPARTMENT AT [facility] Provider Note   CSN: [id number] Arrival date & time: 05/04/24  1818     Patient presents with: Dizziness and Emesis   [name] is a 48 y.o. female.    Dizziness Associated symptoms: vomiting   Emesis    This patient is a 48 year old female, she was seen a couple of days ago because of some intermittent vertigo when she would turn her head to the side as well as some nausea and vomiting.  She reports over the last 2 days she has had some ongoing symptoms, she feels like she has never any better although when she gets a little more clear with the detail she states that when she is laying on her right side she feels much better when she tries to stand up she has to hold onto the wall to walk and has vertigo with nausea.  Multiple episodes of dry heaving, she has a ringing in her left ear with a sounding of a clicking sound in that left ear as well.  She was given meclizine  and Zofran  a couple of days ago.  No other neurologic complaints  Prior to Admission medications   Medication Sig Start Date End Date Taking? Authorizing Provider  ondansetron  (ZOFRAN ) 4 MG tablet Take 1 tablet (4 mg total) by mouth every 6 (six) hours. 05/04/24  Yes Cleotilde Rogue, MD  ascorbic acid (VITAMIN C) 500 MG tablet Take 500 mg by mouth daily.    [provider]  butalbital -acetaminophen -caffeine  (FIORICET, ESGIC) 50-325-40 MG tablet Take 1 tablet by mouth every 4 (four) hours as needed for headache. 02/16/18   Alphonsa Elsie RAMAN, MD  meclizine  (ANTIVERT ) 25 MG tablet Take 1 tablet (25 mg total) by mouth 3 (three) times daily as needed for dizziness. 05/01/24   Cleotilde Rogue, MD  montelukast  (SINGULAIR ) 10 MG tablet TAKE 1 TABLET BY MOUTH EVERYDAY AT BEDTIME 12/03/18   Alphonsa Elsie RAMAN, MD  Multiple Vitamins-Minerals (ADULT GUMMY) CHEW Chew 2 tablets by mouth daily.    [provider]  Multiple Vitamins-Minerals (HAIR SKIN & NAILS  PO) Take 2 tablets by mouth daily. Biotin and collagen gummies    [provider]  omeprazole  (PRILOSEC) 40 MG capsule TAKE ONE DAILY AS NEEDED FOR ACID REFLUX Patient taking differently: Take 40 mg by mouth daily. 12/13/17   Mauro Elveria JAYSON, NP  sertraline  (ZOLOFT ) 100 MG tablet Take 1.5 tablets (150 mg total) by mouth daily. 12/13/17   Mauro Elveria JAYSON, NP    Allergies: Amoxicillin -pot clavulanate, Cefuroxime axetil, Latex, Levaquin [levofloxacin in d5w], Oxycodone , Temazepam , Cephalosporins, and Hydrocodone     Review of Systems  Gastrointestinal:  Positive for vomiting.  Neurological:  Positive for dizziness.  All other systems reviewed and are negative.   Updated Vital Signs BP (!) 141/88   Pulse 82   Temp 98.3 F (36.8 C)   Resp 18   Ht 1.651 m (5' 5)   Wt 83.9 kg   SpO2 96%   BMI 30.79 kg/m   Physical Exam Vitals and nursing note reviewed.  Constitutional:      General: She is not in acute distress.    Appearance: She is well-developed.  HENT:     Head: Normocephalic and atraumatic.     Mouth/Throat:     Pharynx: No oropharyngeal exudate.  Eyes:     General: No scleral icterus.       Right eye: No discharge.  Left eye: No discharge.     Conjunctiva/sclera: Conjunctivae normal.     Pupils: Pupils are equal, round, and reactive to light.  Neck:     Thyroid: No thyromegaly.     Vascular: No JVD.  Cardiovascular:     Rate and Rhythm: Normal rate and regular rhythm.     Heart sounds: Normal heart sounds. No murmur heard.    No friction rub. No gallop.  Pulmonary:     Effort: Pulmonary effort is normal. No respiratory distress.     Breath sounds: Normal breath sounds. No wheezing or rales.  Abdominal:     General: Bowel sounds are normal. There is no distension.     Palpations: Abdomen is soft. There is no mass.     Tenderness: There is no abdominal tenderness.  Musculoskeletal:        General: No tenderness. Normal range of motion.      Cervical back: Normal range of motion and neck supple.     Right lower leg: No edema.     Left lower leg: No edema.  Lymphadenopathy:     Cervical: No cervical adenopathy.  Skin:    General: Skin is warm and dry.     Findings: No erythema or rash.  Neurological:     Mental Status: She is alert.     Coordination: Coordination normal.     Comments: Speech is clear, cranial nerves III through XII are intact, memory is intact, strength is normal in all 4 extremities including grips and strength at the bilateral thighs, knees and ankles to extention and flexion, sensation is intact to light touch and pinprick in all 4 extremities. Coordination as tested by finger-nose-finger is normal, no limb ataxia. Normal gait, normal reflexes at the patellar tendons bilaterally, no nystagmus  Psychiatric:        Behavior: Behavior normal.     (all labs ordered are listed, but only abnormal results are displayed) Labs Reviewed  CBC WITH DIFFERENTIAL/PLATELET - Abnormal; Notable for the following components:      Result Value   RBC 5.16 (*)    MCV 79.7 (*)    MCH 24.4 (*)    All other components within normal limits  COMPREHENSIVE METABOLIC PANEL WITH GFR    EKG: None  Radiology: MR BRAIN WO CONTRAST Result Date: 05/04/2024 CLINICAL DATA:  Initial evaluation for acute syncope/presyncope, stroke suspected. EXAM: MRI HEAD WITHOUT CONTRAST TECHNIQUE: Multiplanar, multiecho pulse sequences of the brain and surrounding structures were obtained without intravenous contrast. COMPARISON:  CT from 05/01/2024. FINDINGS: Brain: Cerebral volume within normal limits. Few scattered subcentimeter foci of T2/FLAIR hyperintensity noted involving the supratentorial cerebral white matter, nonspecific, but most commonly related to chronic microvascular ischemic disease. Changes are minimal in nature. No evidence for acute or subacute infarct. No areas of chronic cortical infarction. No acute or chronic intracranial blood  products. No mass lesion, midline shift or mass effect no hydrocephalus or extra-axial fluid collection. Partially empty sella with prominence of Meckel's cave noted bilaterally. Vascular: Major intracranial vascular flow voids are maintained. Skull and upper cervical spine: Craniocervical junction within normal limits. Decreased T1 signal intensity noted within the visualized bone marrow, nonspecific, but most commonly related to anemia, smoking or obesity. No scalp soft tissue abnormality. Sinuses/Orbits: Globes orbital soft tissues within normal limits. Paranasal sinuses are clear. No mastoid effusion. Other: None. IMPRESSION: Essentially normal brain MRI for age. No acute intracranial abnormality. Electronically Signed   By: Morene Hoard M.D.   On:  05/04/2024 19:36     Procedures   Medications Ordered in the ED  ondansetron  (ZOFRAN ) injection 4 mg (4 mg Intravenous Given 05/04/24 1853)  sodium chloride  0.9 % bolus 1,000 mL (1,000 mLs Intravenous New Bag/Given 05/04/24 1853)  diazepam (VALIUM) tablet 5 mg (5 mg Oral Given 05/04/24 2015)  dexamethasone  (DECADRON ) injection 10 mg (10 mg Intravenous Given 05/04/24 2015)                                    Medical Decision Making Amount and/or Complexity of Data Reviewed Labs: ordered. Radiology: ordered.  Risk Prescription drug management.   The patient has no signs of nystagmus or neurologic complaint but has had fairly severe vertigo for couple of days, will obtain MRI of the brain, symptomatic care  MRI normal, patient improved with Decadron  and Valium, no significant signs of fever tachycardia or hypotension, no sepsis, the patient is agreeable to follow-up with ENT     Final diagnoses:  Vertigo    ED Discharge Orders          Ordered    ondansetron  (ZOFRAN ) 4 MG tablet  Every 6 hours        05/04/24 2121               Cleotilde Rogue, MD 05/04/24 2123

## 2024-05-06 ENCOUNTER — Ambulatory Visit

## 2024-05-06 VITALS — BP 151/93 | HR 85 | Temp 98.2°F | Ht 65.0 in | Wt 197.5 lb

## 2024-05-06 DIAGNOSIS — J019 Acute sinusitis, unspecified: Secondary | ICD-10-CM | POA: Diagnosis not present

## 2024-05-06 DIAGNOSIS — B9689 Other specified bacterial agents as the cause of diseases classified elsewhere: Secondary | ICD-10-CM | POA: Diagnosis not present

## 2024-05-06 DIAGNOSIS — R110 Nausea: Secondary | ICD-10-CM | POA: Diagnosis not present

## 2024-05-06 DIAGNOSIS — R42 Dizziness and giddiness: Secondary | ICD-10-CM

## 2024-05-06 MED ORDER — MECLIZINE HCL 25 MG PO TABS: 25.0000 mg | ORAL_TABLET | Freq: Two times a day (BID) | ORAL | 0 refills | Status: AC | PRN

## 2024-05-06 MED ORDER — DOXYCYCLINE HYCLATE 100 MG PO TABS: 100.0000 mg | ORAL_TABLET | Freq: Two times a day (BID) | ORAL | 0 refills | Status: AC

## 2024-05-06 NOTE — Progress Notes (Addendum)
 "  New Patient Office Visit  Subjective    Patient ID: [name], female    DOB: Feb 01, 1976  Age: 48 y.o. MRN: [id number]  CC:  Chief Complaint  Patient presents with   Follow-up    Patient is here for a hospital follow up. Patient went to the hospital last Wednesday with vertigo, throwing up, nauseous. They ran a CT scan and X-ray and everything was clear. Patient went back too the hospital Saturday with the same symptoms and they did an MRI and everything was clear.  She was put on fluid/steroid at the hospital.     HPI [name] presents to establish care, and to follow-up after a visit to the emergency department. Was seen at [facility] on 05/01/24 due to vertigo and vomiting. CT head was negative. ED prescribed meclizine  and zofran , and discharged her home. Went back to the ED on 05/04/24 due to not being able to keep fluids or food down due to vomiting. MRI was normal. Patient improved after decadron  and valium , and was discharged home with a referral to ENT.   Describes current symptoms as she feels like the room is spinning, and it gets worse with movement of her head or with walking. Has had multiple episodes of vomiting and dry heaving. Has had trouble keeping food and fluids down. Is only able to tolerate cranberry juice and the BRAT diet. Has zofran  that was prescribed to her at the hospital, and it helps her with nausea. Also endorses ringing and popping in her ears, along with a clicking sound in her ears as well. Symptoms improve with lying on her right side. Has a history of motion sickness as well.   Also reports that she has had symptoms of a sinus infection since October. Reports sinus pain when moving her head forward, and complains of her right ear popping. Denies fever and congestion, but reports that she has had chills at home since she initially went to the hospital on 05/01/24. Denies ear pain or nasal drainage.   Outpatient Encounter Medications as of 05/06/2024   Medication Sig   ascorbic acid (VITAMIN C) 500 MG tablet Take 500 mg by mouth daily.   butalbital -acetaminophen -caffeine  (FIORICET, ESGIC) 50-325-40 MG tablet Take 1 tablet by mouth every 4 (four) hours as needed for headache.   doxycycline  (VIBRA -TABS) 100 MG tablet Take 1 tablet (100 mg total) by mouth 2 (two) times daily.   meclizine  (ANTIVERT ) 25 MG tablet Take 1 tablet (25 mg total) by mouth 2 (two) times daily as needed for dizziness.   montelukast  (SINGULAIR ) 10 MG tablet TAKE 1 TABLET BY MOUTH EVERYDAY AT BEDTIME   Multiple Vitamins-Minerals (ADULT GUMMY) CHEW Chew 2 tablets by mouth daily.   Multiple Vitamins-Minerals (HAIR SKIN & NAILS PO) Take 2 tablets by mouth daily. Biotin and collagen gummies   omeprazole  (PRILOSEC) 40 MG capsule TAKE ONE DAILY AS NEEDED FOR ACID REFLUX (Patient taking differently: Take 40 mg by mouth daily.)   ondansetron  (ZOFRAN ) 4 MG tablet Take 1 tablet (4 mg total) by mouth every 6 (six) hours.   sertraline  (ZOLOFT ) 100 MG tablet Take 1.5 tablets (150 mg total) by mouth daily.   [DISCONTINUED] meclizine  (ANTIVERT ) 25 MG tablet Take 1 tablet (25 mg total) by mouth 3 (three) times daily as needed for dizziness.   No facility-administered encounter medications on file as of 05/06/2024.    Past Medical History:  Diagnosis Date   Depression    for OCD   Exercise-induced  asthma    in high school   GERD (gastroesophageal reflux disease)    Gestational diabetes    IBS (irritable bowel syndrome)    Insomnia    Migraine    q 3 months maybe (05/02/2018)   Nausea    PONV (postoperative nausea and vomiting)    Urticaria     Past Surgical History:  Procedure Laterality Date   ANTERIOR CERVICAL DECOMP/DISCECTOMY FUSION N/A 05/01/2018   Procedure: Cervical Six-Seven Anterior cervical decompression/discectomy/fusion;  Surgeon: Cheryle Debby LABOR, MD;  Location: [facility] OR;  Service: Neurosurgery;  Laterality: N/A;  anterior   COLONOSCOPY N/A 09/12/2014   SLF: 1.  No obvious source for diarrhea/ abdominal pain identified. IIt's most likely due to IBS-D. 2. Mild diverticulosis in the right colon 3. Moderate sized external hemorrhoids.    EVACUATION OF CERVICAL HEMATOMA N/A 05/01/2018   Procedure: EVACUATION OF CERVICAL HEMATOMA;  Surgeon: Cheryle Debby LABOR, MD;  Location: [facility] OR;  Service: Neurosurgery;  Laterality: N/A;    Family History  Problem Relation Age of Onset   Allergic rhinitis Mother    Asthma Brother    Colon cancer Neg Hx    Eczema Neg Hx    Urticaria Neg Hx     Social History   Socioeconomic History   Marital status: Married    Spouse name: Not on file   Number of children: Not on file   Years of education: Not on file   Highest education level: Not on file  Occupational History   Not on file  Tobacco Use   Smoking status: Former    Types: Cigarettes   Smokeless tobacco: Never   Tobacco comments:    smoked a little in college  Vaping Use   Vaping status: Never Used  Substance and Sexual Activity   Alcohol use: Yes    Comment: 05/02/2018 glass/month   Drug use: Not Currently    Types: Marijuana    Comment: 05/02/2018 nothing since college   Sexual activity: Yes    Birth control/protection: Condom  Other Topics Concern   Not on file  Social History Narrative   Not on file   Social Drivers of Health   Financial Resource Strain: Not on file  Food Insecurity: Not on file  Transportation Needs: Not on file  Physical Activity: Not on file  Stress: Not on file  Social Connections: Not on file  Intimate Partner Violence: Not on file    Review of Systems  Constitutional:  Positive for chills and malaise/fatigue. Negative for fever.  HENT:  Positive for sinus pain and tinnitus. Negative for congestion, ear pain and hearing loss.   Respiratory:  Negative for cough, shortness of breath and wheezing.   Cardiovascular:  Negative for chest pain.  Gastrointestinal:  Positive for nausea and vomiting. Negative for  abdominal pain.  Neurological:  Positive for dizziness. Negative for headaches.    Objective    BP (!) 151/93 (BP Location: Right Arm, Patient Position: Sitting)   Pulse 85   Temp 98.2 F (36.8 C)   Ht 5' 5 (1.651 m)   Wt 197 lb 8 oz (89.6 kg)   SpO2 100%   BMI 32.87 kg/m   Physical Exam Vitals and nursing note reviewed.  Constitutional:      General: She is not in acute distress.    Appearance: Normal appearance. She is not ill-appearing.  HENT:     Right Ear: Hearing, ear canal and external ear normal. No decreased hearing noted. A middle  ear effusion is present. Tympanic membrane is not injected.     Left Ear: Hearing, ear canal and external ear normal. No decreased hearing noted. A middle ear effusion is present. Tympanic membrane is not injected.     Nose: No congestion.     Right Sinus: Frontal sinus tenderness present. No maxillary sinus tenderness.     Left Sinus: Frontal sinus tenderness present. No maxillary sinus tenderness.     Mouth/Throat:     Mouth: Mucous membranes are moist.  Eyes:     Extraocular Movements:     Right eye: Normal extraocular motion and no nystagmus.     Left eye: Normal extraocular motion and no nystagmus.  Cardiovascular:     Rate and Rhythm: Normal rate and regular rhythm.     Heart sounds: Normal heart sounds, S1 normal and S2 normal. No murmur heard. Pulmonary:     Effort: Pulmonary effort is normal. No respiratory distress.     Breath sounds: Normal breath sounds. No wheezing.  Neurological:     Mental Status: She is alert and oriented to person, place, and time.     Cranial Nerves: No cranial nerve deficit.     Sensory: No sensory deficit.     Motor: No weakness.     Coordination: Coordination normal.     Gait: Gait normal.     Comments: Patient endorses dizziness while walking.   Psychiatric:        Mood and Affect: Mood normal.        Behavior: Behavior normal.        Thought Content: Thought content normal.         Judgment: Judgment normal.    Assessment & Plan:  1. Vertigo (Primary) -Due to patient having sinusitis symptoms for over two months, and current sinus pain and mid ear effusion, the vertigo may be attributable to these ongoing sinus issues. Discussed this with patient. Advised patient that if symptoms do not improve in a month or after course of antibiotics, and unable to get in with ENT at that time, I would like her to follow-up.  - Ambulatory referral to ENT - meclizine  (ANTIVERT ) 25 MG tablet; Take 1 tablet (25 mg total) by mouth 2 (two) times daily as needed for dizziness.  Dispense: 20 tablet; Refill: 0 -Advised patient that this medication may cause drowsiness, and advised her to not drive on the medication. May also cause anticholinergic effects, and it is not advised to be taken for long periods of time to treat vertigo.   2. Acute bacterial sinusitis -If symptoms suddenly get worse after starting to improve, or if the patient develops a high fever to get evaluated immediately, and notify the office.  - doxycycline  (VIBRA -TABS) 100 MG tablet; Take 1 tablet (100 mg total) by mouth 2 (two) times daily.  Dispense: 10 tablet; Refill: 0  3. Nausea -Encouraged the patient to follow the BRAT diet as tolerated and increase fluid intake as much as possible. -Gave warning signs of dehydration. Advised patient that if she is unable to keep food or fluids down, and has symptoms of dehydration to get evaluated immediately.    Return if symptoms worsen or fail to improve.   Damien KATHEE Pringle, FNP  Patient was seen at [facility] on 05/06/2024 . Originally entered in error.   Thanks, Damien Pringle, FNP-BC   "

## 2024-05-09 ENCOUNTER — Encounter (INDEPENDENT_AMBULATORY_CARE_PROVIDER_SITE_OTHER): Payer: Self-pay

## 2024-06-11 ENCOUNTER — Encounter: Payer: Self-pay | Admitting: Family Medicine

## 2024-07-09 ENCOUNTER — Ambulatory Visit (HOSPITAL_COMMUNITY)
